# Patient Record
Sex: Male | Born: 1995 | Race: White | Hispanic: No | Marital: Single | Smoking: Former smoker
Health system: Southern US, Community
[De-identification: ages and names within clinical notes are randomized; demographics above are authoritative.]

## PROBLEM LIST (undated history)

## (undated) VITALS — BP 131/74 | HR 109 | Temp 98.1°F | Resp 16 | Ht 69.0 in | Wt 141.0 lb

## (undated) DIAGNOSIS — F329 Major depressive disorder, single episode, unspecified: Secondary | ICD-10-CM

## (undated) DIAGNOSIS — F4310 Post-traumatic stress disorder, unspecified: Secondary | ICD-10-CM

## (undated) DIAGNOSIS — F419 Anxiety disorder, unspecified: Secondary | ICD-10-CM

## (undated) DIAGNOSIS — F32A Depression, unspecified: Secondary | ICD-10-CM

## (undated) DIAGNOSIS — F603 Borderline personality disorder: Secondary | ICD-10-CM

## (undated) DIAGNOSIS — T7692XA Unspecified child maltreatment, suspected, initial encounter: Secondary | ICD-10-CM

## (undated) DIAGNOSIS — N200 Calculus of kidney: Secondary | ICD-10-CM

## (undated) HISTORY — DX: Calculus of kidney: N20.0

## (undated) HISTORY — DX: Anxiety disorder, unspecified: F41.9

## (undated) HISTORY — PX: INGUINAL HERNIA REPAIR: SUR1180

## (undated) HISTORY — DX: Unspecified child maltreatment, suspected, initial encounter: T76.92XA

## (undated) HISTORY — DX: Major depressive disorder, single episode, unspecified: F32.9

## (undated) HISTORY — DX: Depression, unspecified: F32.A

---
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
## 2014-05-28 ENCOUNTER — Encounter (HOSPITAL_COMMUNITY): Payer: Self-pay | Admitting: Emergency Medicine

## 2014-05-28 ENCOUNTER — Emergency Department (HOSPITAL_COMMUNITY)
Admission: EM | Admit: 2014-05-28 | Discharge: 2014-05-28 | Disposition: A | Discharge status: Home / Self Care | Payer: Medicaid Other | Attending: Emergency Medicine | Admitting: Emergency Medicine

## 2014-05-28 DIAGNOSIS — Z87442 Personal history of urinary calculi: Secondary | ICD-10-CM | POA: Insufficient documentation

## 2014-05-28 DIAGNOSIS — R0981 Nasal congestion: Secondary | ICD-10-CM | POA: Insufficient documentation

## 2014-05-28 DIAGNOSIS — Z8659 Personal history of other mental and behavioral disorders: Secondary | ICD-10-CM | POA: Insufficient documentation

## 2014-05-28 DIAGNOSIS — Z87891 Personal history of nicotine dependence: Secondary | ICD-10-CM | POA: Insufficient documentation

## 2014-05-28 DIAGNOSIS — J029 Acute pharyngitis, unspecified: Secondary | ICD-10-CM

## 2014-05-28 MED ORDER — AMOXICILLIN 500 MG PO CAPS: 500.0000 mg | ORAL_CAPSULE | Freq: Two times a day (BID) | ORAL | Status: DC

## 2014-05-28 NOTE — ED Notes (Signed)
Pt states he has been having sore throat, body aches, nasal congestion, and cough x 1 wk w/ fever.
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
## 2014-05-28 NOTE — ED Notes (Signed)
Patient would like to speak with someone about not being able to afford his meds, like the Child psychotherapistsocial worker or case Production designer, theatre/television/filmmanager

## 2014-05-29 NOTE — ED Provider Notes (Signed)
Medical screening examination/treatment/procedure(s) were performed by non-physician practitioner and as supervising physician I was immediately available for consultation/collaboration.   EKG Interpretation None        Purvis SheffieldForrest Sunnie Odden, MD 05/29/14 [phone]

## 2014-08-26 ENCOUNTER — Emergency Department (HOSPITAL_COMMUNITY)
Admission: EM | Admit: 2014-08-26 | Discharge: 2014-08-27 | Disposition: A | Discharge status: Home / Self Care | Payer: Medicaid Other | Attending: Emergency Medicine | Admitting: Emergency Medicine

## 2014-08-26 ENCOUNTER — Encounter (HOSPITAL_COMMUNITY): Payer: Self-pay | Admitting: Emergency Medicine

## 2014-08-26 DIAGNOSIS — R4689 Other symptoms and signs involving appearance and behavior: Secondary | ICD-10-CM

## 2014-08-26 DIAGNOSIS — T43592A Poisoning by other antipsychotics and neuroleptics, intentional self-harm, initial encounter: Secondary | ICD-10-CM | POA: Insufficient documentation

## 2014-08-26 DIAGNOSIS — Y9389 Activity, other specified: Secondary | ICD-10-CM | POA: Insufficient documentation

## 2014-08-26 DIAGNOSIS — R4589 Other symptoms and signs involving emotional state: Secondary | ICD-10-CM

## 2014-08-26 DIAGNOSIS — Y998 Other external cause status: Secondary | ICD-10-CM | POA: Insufficient documentation

## 2014-08-26 DIAGNOSIS — F329 Major depressive disorder, single episode, unspecified: Secondary | ICD-10-CM

## 2014-08-26 DIAGNOSIS — Z792 Long term (current) use of antibiotics: Secondary | ICD-10-CM | POA: Insufficient documentation

## 2014-08-26 DIAGNOSIS — X58XXXA Exposure to other specified factors, initial encounter: Secondary | ICD-10-CM | POA: Insufficient documentation

## 2014-08-26 DIAGNOSIS — F32A Depression, unspecified: Secondary | ICD-10-CM

## 2014-08-26 DIAGNOSIS — Z87442 Personal history of urinary calculi: Secondary | ICD-10-CM | POA: Insufficient documentation

## 2014-08-26 DIAGNOSIS — Z87891 Personal history of nicotine dependence: Secondary | ICD-10-CM | POA: Insufficient documentation

## 2014-08-26 DIAGNOSIS — Y9289 Other specified places as the place of occurrence of the external cause: Secondary | ICD-10-CM | POA: Insufficient documentation

## 2014-08-26 DIAGNOSIS — Z8709 Personal history of other diseases of the respiratory system: Secondary | ICD-10-CM | POA: Insufficient documentation

## 2014-08-26 DIAGNOSIS — T50902A Poisoning by unspecified drugs, medicaments and biological substances, intentional self-harm, initial encounter: Secondary | ICD-10-CM

## 2014-08-26 LAB — COMPREHENSIVE METABOLIC PANEL
ALBUMIN: 4.4 g/dL (ref 3.5–5.2)
ALT: 13 U/L (ref 0–53)
AST: 21 U/L (ref 0–37)
Alkaline Phosphatase: 66 U/L (ref 39–117)
Anion gap: 7 (ref 5–15)
BUN: 8 mg/dL (ref 6–23)
CO2: 24 mmol/L (ref 19–32)
Calcium: 8.9 mg/dL (ref 8.4–10.5)
Chloride: 106 mEq/L (ref 96–112)
Creatinine, Ser: 0.8 mg/dL (ref 0.50–1.35)
GFR calc Af Amer: >90 mL/min (ref >90)
Glucose, Bld: 121 mg/dL — ABNORMAL HIGH (ref 70–99)
POTASSIUM: 3.7 mmol/L (ref 3.5–5.1)
SODIUM: 137 mmol/L (ref 135–145)
TOTAL PROTEIN: 6.8 g/dL (ref 6.0–8.3)
Total Bilirubin: 0.9 mg/dL (ref 0.3–1.2)

## 2014-08-26 LAB — URINALYSIS, ROUTINE W REFLEX MICROSCOPIC
Bilirubin Urine: NEGATIVE
GLUCOSE, UA: NEGATIVE mg/dL
Hgb urine dipstick: NEGATIVE
Ketones, ur: NEGATIVE mg/dL
LEUKOCYTES UA: NEGATIVE
NITRITE: NEGATIVE
PH: 6 (ref 5.0–8.0)
Protein, ur: NEGATIVE mg/dL
Specific Gravity, Urine: 1.007 (ref 1.005–1.030)
Urobilinogen, UA: 1 mg/dL (ref 0.0–1.0)

## 2014-08-26 LAB — CBC
HEMATOCRIT: 44.9 % (ref 39.0–52.0)
Hemoglobin: 14.7 g/dL (ref 13.0–17.0)
MCH: 29.7 pg (ref 26.0–34.0)
MCHC: 32.7 g/dL (ref 30.0–36.0)
MCV: 90.7 fL (ref 78.0–100.0)
PLATELETS: 253 10*3/uL (ref 150–400)
RBC: 4.95 MIL/uL (ref 4.22–5.81)
RDW: 13.5 % (ref 11.5–15.5)
WBC: 6.3 10*3/uL (ref 4.0–10.5)

## 2014-08-26 LAB — SALICYLATE LEVEL: Salicylate Lvl: <4 mg/dL (ref 2.8–20.0)

## 2014-08-26 LAB — RAPID URINE DRUG SCREEN, HOSP PERFORMED
Amphetamines: NOT DETECTED
Barbiturates: NOT DETECTED
Benzodiazepines: NOT DETECTED
COCAINE: NOT DETECTED
OPIATES: NOT DETECTED
TETRAHYDROCANNABINOL: POSITIVE — AB

## 2014-08-26 LAB — ACETAMINOPHEN LEVEL: Acetaminophen (Tylenol), Serum: <10 ug/mL — ABNORMAL LOW (ref 10–30)

## 2014-08-26 LAB — ETHANOL: Alcohol, Ethyl (B): <5 mg/dL (ref 0–9)

## 2014-08-26 MED ORDER — LORAZEPAM 1 MG PO TABS
2.0000 mg | ORAL_TABLET | Freq: Once | ORAL | Status: AC
  Administered 2014-08-26: 2 mg via ORAL
  Filled 2014-08-26: qty 2
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
## 2014-08-26 NOTE — ED Notes (Signed)
Pt sent with [facility] from [facility] for med clearance. Pt has a bed there when medically cleared. Pt states that he has been depressed and suicidal and was [facility]. Pt went home and took two 150 mg Seroquel, two shots of liquor, and smoked some marijuana. Then pt [facility] and was sent here for clearance d/t his HR of 122. Pt states he is SI and denies HI.

## 2014-08-26 NOTE — ED Notes (Signed)
Spoke with lab about pending lab results. States they labs are waiting to be released.

## 2014-08-26 NOTE — Discharge Instructions (Signed)

## 2014-08-31 ENCOUNTER — Inpatient Hospital Stay (HOSPITAL_COMMUNITY)
Admission: EM | Admit: 2014-08-31 | Discharge: 2014-09-08 | DRG: 918 | Disposition: A | Discharge status: Other Acute Inpatient Hospital | Payer: Medicaid Other | Attending: Internal Medicine | Admitting: Internal Medicine

## 2014-08-31 ENCOUNTER — Encounter (HOSPITAL_COMMUNITY): Payer: Self-pay | Admitting: Emergency Medicine

## 2014-08-31 DIAGNOSIS — R45851 Suicidal ideations: Secondary | ICD-10-CM | POA: Diagnosis present

## 2014-08-31 DIAGNOSIS — F1099 Alcohol use, unspecified with unspecified alcohol-induced disorder: Secondary | ICD-10-CM | POA: Diagnosis present

## 2014-08-31 DIAGNOSIS — Z87442 Personal history of urinary calculi: Secondary | ICD-10-CM

## 2014-08-31 DIAGNOSIS — F209 Schizophrenia, unspecified: Secondary | ICD-10-CM | POA: Diagnosis present

## 2014-08-31 DIAGNOSIS — F329 Major depressive disorder, single episode, unspecified: Secondary | ICD-10-CM | POA: Diagnosis present

## 2014-08-31 DIAGNOSIS — R451 Restlessness and agitation: Secondary | ICD-10-CM | POA: Diagnosis present

## 2014-08-31 DIAGNOSIS — F419 Anxiety disorder, unspecified: Secondary | ICD-10-CM | POA: Diagnosis present

## 2014-08-31 DIAGNOSIS — R441 Visual hallucinations: Secondary | ICD-10-CM | POA: Diagnosis present

## 2014-08-31 DIAGNOSIS — T43501A Poisoning by unspecified antipsychotics and neuroleptics, accidental (unintentional), initial encounter: Secondary | ICD-10-CM | POA: Diagnosis present

## 2014-08-31 DIAGNOSIS — F1290 Cannabis use, unspecified, uncomplicated: Secondary | ICD-10-CM | POA: Diagnosis present

## 2014-08-31 DIAGNOSIS — Z62819 Personal history of unspecified abuse in childhood: Secondary | ICD-10-CM | POA: Diagnosis present

## 2014-08-31 DIAGNOSIS — T5791XA Toxic effect of unspecified inorganic substance, accidental (unintentional), initial encounter: Secondary | ICD-10-CM

## 2014-08-31 DIAGNOSIS — K5900 Constipation, unspecified: Secondary | ICD-10-CM | POA: Diagnosis not present

## 2014-08-31 DIAGNOSIS — M549 Dorsalgia, unspecified: Secondary | ICD-10-CM | POA: Diagnosis present

## 2014-08-31 DIAGNOSIS — Z79899 Other long term (current) drug therapy: Secondary | ICD-10-CM

## 2014-08-31 DIAGNOSIS — Z87891 Personal history of nicotine dependence: Secondary | ICD-10-CM

## 2014-08-31 DIAGNOSIS — R682 Dry mouth, unspecified: Secondary | ICD-10-CM | POA: Diagnosis present

## 2014-08-31 DIAGNOSIS — R569 Unspecified convulsions: Secondary | ICD-10-CM | POA: Diagnosis present

## 2014-08-31 DIAGNOSIS — R631 Polydipsia: Secondary | ICD-10-CM | POA: Diagnosis present

## 2014-08-31 DIAGNOSIS — R440 Auditory hallucinations: Secondary | ICD-10-CM | POA: Diagnosis present

## 2014-08-31 DIAGNOSIS — T43502A Poisoning by unspecified antipsychotics and neuroleptics, intentional self-harm, initial encounter: Secondary | ICD-10-CM

## 2014-08-31 DIAGNOSIS — E876 Hypokalemia: Secondary | ICD-10-CM | POA: Diagnosis present

## 2014-08-31 DIAGNOSIS — J309 Allergic rhinitis, unspecified: Secondary | ICD-10-CM | POA: Diagnosis present

## 2014-08-31 DIAGNOSIS — T43592A Poisoning by other antipsychotics and neuroleptics, intentional self-harm, initial encounter: Principal | ICD-10-CM | POA: Diagnosis present

## 2014-08-31 DIAGNOSIS — Y900 Blood alcohol level of less than 20 mg/100 ml: Secondary | ICD-10-CM | POA: Diagnosis present

## 2014-08-31 DIAGNOSIS — F32A Depression, unspecified: Secondary | ICD-10-CM | POA: Diagnosis present

## 2014-08-31 DIAGNOSIS — R0789 Other chest pain: Secondary | ICD-10-CM | POA: Diagnosis present

## 2014-08-31 DIAGNOSIS — Z91018 Allergy to other foods: Secondary | ICD-10-CM | POA: Diagnosis not present

## 2014-08-31 DIAGNOSIS — N179 Acute kidney failure, unspecified: Secondary | ICD-10-CM | POA: Diagnosis present

## 2014-08-31 DIAGNOSIS — T6591XA Toxic effect of unspecified substance, accidental (unintentional), initial encounter: Secondary | ICD-10-CM | POA: Insufficient documentation

## 2014-08-31 DIAGNOSIS — T1491XA Suicide attempt, initial encounter: Secondary | ICD-10-CM

## 2014-08-31 DIAGNOSIS — R000 Tachycardia, unspecified: Secondary | ICD-10-CM | POA: Diagnosis present

## 2014-08-31 LAB — RAPID URINE DRUG SCREEN, HOSP PERFORMED
Amphetamines: NOT DETECTED
BENZODIAZEPINES: NOT DETECTED
Barbiturates: NOT DETECTED
Cocaine: NOT DETECTED
OPIATES: NOT DETECTED
TETRAHYDROCANNABINOL: POSITIVE — AB

## 2014-08-31 LAB — CBC WITH DIFFERENTIAL/PLATELET
BASOS ABS: 0 10*3/uL (ref 0.0–0.1)
BASOS PCT: 0 % (ref 0–1)
EOS ABS: 0 10*3/uL (ref 0.0–0.7)
Eosinophils Relative: 0 % (ref 0–5)
HCT: 43.9 % (ref 39.0–52.0)
Hemoglobin: 14.8 g/dL (ref 13.0–17.0)
LYMPHS ABS: 2.3 10*3/uL (ref 0.7–4.0)
Lymphocytes Relative: 21 % (ref 12–46)
MCH: 30.5 pg (ref 26.0–34.0)
MCHC: 33.7 g/dL (ref 30.0–36.0)
MCV: 90.3 fL (ref 78.0–100.0)
MONOS PCT: 13 % — AB (ref 3–12)
Monocytes Absolute: 1.4 10*3/uL — ABNORMAL HIGH (ref 0.1–1.0)
Neutro Abs: 7.4 10*3/uL (ref 1.7–7.7)
Neutrophils Relative %: 66 % (ref 43–77)
Platelets: 215 10*3/uL (ref 150–400)
RBC: 4.86 MIL/uL (ref 4.22–5.81)
RDW: 14.3 % (ref 11.5–15.5)
WBC: 11.1 10*3/uL — AB (ref 4.0–10.5)

## 2014-08-31 LAB — BASIC METABOLIC PANEL
Anion gap: 10 (ref 5–15)
BUN: 12 mg/dL (ref 6–23)
CHLORIDE: 115 meq/L — AB (ref 96–112)
CO2: 21 mmol/L (ref 19–32)
CREATININE: 1.94 mg/dL — AB (ref 0.50–1.35)
Calcium: 8.8 mg/dL (ref 8.4–10.5)
GFR, EST AFRICAN AMERICAN: 56 mL/min — AB (ref >90)
GFR, EST NON AFRICAN AMERICAN: 48 mL/min — AB (ref >90)
Glucose, Bld: 97 mg/dL (ref 70–99)
Potassium: 3.3 mmol/L — ABNORMAL LOW (ref 3.5–5.1)
Sodium: 146 mmol/L — ABNORMAL HIGH (ref 135–145)

## 2014-08-31 LAB — ETHANOL

## 2014-08-31 LAB — COMPREHENSIVE METABOLIC PANEL
ANION GAP: 22 — AB (ref 5–15)
AST: 55 U/L — ABNORMAL HIGH (ref 0–37)
Albumin: 3.8 g/dL (ref 3.5–5.2)
Alkaline Phosphatase: 70 U/L (ref 39–117)
BILIRUBIN TOTAL: 0.8 mg/dL (ref 0.3–1.2)
BUN: 14 mg/dL (ref 6–23)
CALCIUM: 9.4 mg/dL (ref 8.4–10.5)
CO2: 14 mmol/L — AB (ref 19–32)
Chloride: 109 mEq/L (ref 96–112)
Creatinine, Ser: 2.72 mg/dL — ABNORMAL HIGH (ref 0.50–1.35)
GFR, EST AFRICAN AMERICAN: 37 mL/min — AB (ref >90)
GFR, EST NON AFRICAN AMERICAN: 32 mL/min — AB (ref >90)
Glucose, Bld: 190 mg/dL — ABNORMAL HIGH (ref 70–99)
POTASSIUM: 3.6 mmol/L (ref 3.5–5.1)
Sodium: 145 mmol/L (ref 135–145)
Total Protein: 6.7 g/dL (ref 6.0–8.3)

## 2014-08-31 LAB — SALICYLATE LEVEL: Salicylate Lvl: <4 mg/dL (ref 2.8–20.0)

## 2014-08-31 LAB — MAGNESIUM
MAGNESIUM: 2.2 mg/dL (ref 1.5–2.5)
Magnesium: 2.5 mg/dL (ref 1.5–2.5)

## 2014-08-31 LAB — ACETAMINOPHEN LEVEL: Acetaminophen (Tylenol), Serum: <10 ug/mL — ABNORMAL LOW (ref 10–30)

## 2014-08-31 LAB — MRSA PCR SCREENING: MRSA BY PCR: NEGATIVE

## 2014-08-31 MED ORDER — HALOPERIDOL LACTATE 5 MG/ML IJ SOLN
5.0000 mg | Freq: Once | INTRAMUSCULAR | Status: DC
  Filled 2014-08-31: qty 1

## 2014-08-31 MED ORDER — POTASSIUM CHLORIDE CRYS ER 20 MEQ PO TBCR
40.0000 meq | EXTENDED_RELEASE_TABLET | Freq: Once | ORAL | Status: AC
  Administered 2014-08-31: 40 meq via ORAL
  Filled 2014-08-31: qty 2

## 2014-08-31 MED ORDER — LORAZEPAM 2 MG/ML IJ SOLN
2.0000 mg | Freq: Once | INTRAMUSCULAR | Status: AC
  Administered 2014-08-31: 2 mg via INTRAVENOUS
  Filled 2014-08-31: qty 1

## 2014-08-31 MED ORDER — SODIUM CHLORIDE 0.9 % IV SOLN
INTRAVENOUS | Status: DC
  Administered 2014-09-01: 100 mL/h via INTRAVENOUS

## 2014-08-31 MED ORDER — SODIUM CHLORIDE 0.9 % IV BOLUS (SEPSIS)
1000.0000 mL | Freq: Once | INTRAVENOUS | Status: AC
  Administered 2014-08-31: 1000 mL via INTRAVENOUS

## 2014-08-31 MED ORDER — HALOPERIDOL LACTATE 5 MG/ML IJ SOLN
5.0000 mg | Freq: Once | INTRAMUSCULAR | Status: AC
  Administered 2014-08-31: 5 mg via INTRAVENOUS
  Filled 2014-08-31: qty 1

## 2014-08-31 MED ORDER — HALOPERIDOL LACTATE 5 MG/ML IJ SOLN
INTRAMUSCULAR | Status: AC
  Filled 2014-08-31: qty 1

## 2014-08-31 MED ORDER — NALOXONE HCL 1 MG/ML IJ SOLN
1.0000 mg | Freq: Once | INTRAMUSCULAR | Status: AC
  Administered 2014-08-31: 1 mg via INTRAVENOUS
  Filled 2014-08-31: qty 2

## 2014-08-31 MED ORDER — ENOXAPARIN SODIUM 40 MG/0.4ML ~~LOC~~ SOLN
40.0000 mg | SUBCUTANEOUS | Status: DC
  Administered 2014-08-31 – 2014-09-07 (×7): 40 mg via SUBCUTANEOUS
  Filled 2014-08-31 (×9): qty 0.4

## 2014-08-31 MED ORDER — SODIUM CHLORIDE 0.9 % IJ SOLN
3.0000 mL | Freq: Two times a day (BID) | INTRAMUSCULAR | Status: DC
  Administered 2014-08-31 – 2014-09-03 (×2): 3 mL via INTRAVENOUS

## 2014-08-31 MED ORDER — HALOPERIDOL LACTATE 5 MG/ML IJ SOLN
5.0000 mg | Freq: Four times a day (QID) | INTRAMUSCULAR | Status: DC | PRN
  Administered 2014-08-31: 5 mg via INTRAVENOUS

## 2014-08-31 MED ORDER — LORAZEPAM 2 MG/ML IJ SOLN: 1.0000 mg | Freq: Once | INTRAMUSCULAR | Status: DC

## 2014-08-31 MED ORDER — LORAZEPAM 2 MG/ML IJ SOLN
1.0000 mg | Freq: Once | INTRAMUSCULAR | Status: AC
  Administered 2014-08-31: 1 mg via INTRAVENOUS
  Filled 2014-08-31: qty 1

## 2014-08-31 MED ORDER — WHITE PETROLATUM GEL
Status: AC
  Administered 2014-08-31: 0.2
  Filled 2014-08-31: qty 1

## 2014-08-31 MED ORDER — LORAZEPAM 2 MG/ML IJ SOLN
1.0000 mg | INTRAMUSCULAR | Status: DC | PRN
  Administered 2014-08-31 – 2014-09-01 (×2): 1 mg via INTRAVENOUS
  Filled 2014-08-31 (×2): qty 1

## 2014-08-31 NOTE — ED Notes (Signed)
Per poison control pt needs EKG prior to discharge

## 2014-08-31 NOTE — ED Notes (Signed)
Attempted report 

## 2014-08-31 NOTE — ED Notes (Signed)
Sitter at bedside.

## 2014-08-31 NOTE — ED Notes (Signed)
Admitting MD at bedside.

## 2014-08-31 NOTE — ED Notes (Signed)
Pt was standing with assistance of James, EMT to attempt a urine sample. Pt then dropped urinal on the floor. Unable to obtain sample.

## 2014-08-31 NOTE — ED Notes (Addendum)
Pt via [facility].  Roommate reported pt stumbled out of his room this am and fell having seizure like activity.  Went in room and saw two empty 300mg  seroquel bottles filled on Jan 6th and 7th for a total 37 pills.  EMS reported agonal breathing. GCS 10 on arrival. EKG showed questionable SVT or ST.  NAD at this time.

## 2014-08-31 NOTE — ED Notes (Signed)
Attempted orthostatics. Completed orthostatics laying down and sitting up. Patient is unable to stand up safely at this time.

## 2014-08-31 NOTE — ED Notes (Signed)
Bladder scan showed max volume of after 4 readings

## 2014-08-31 NOTE — ED Notes (Signed)
Pt became awake and alert.  Refused to tell this RN what happened or what he remembered. Notified Dr Littie DeedsGentry.
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
## 2014-08-31 NOTE — ED Notes (Signed)
t-shirt was cut off by EMS, boxers removed, samsung cell phone given to security.
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
## 2014-08-31 NOTE — ED Notes (Addendum)
Per poison control, no charcoal due to unknown time of ingestion.  Possible side effects of OD of Seroquel tachycardia, seizures, QT prolongation, and urinary retention.  Peak time for this medication is approx 6 hours.

## 2014-09-01 DIAGNOSIS — N179 Acute kidney failure, unspecified: Secondary | ICD-10-CM

## 2014-09-01 DIAGNOSIS — F19921 Other psychoactive substance use, unspecified with intoxication with delirium: Secondary | ICD-10-CM

## 2014-09-01 LAB — CBC
HCT: 38.1 % — ABNORMAL LOW (ref 39.0–52.0)
HEMOGLOBIN: 12.6 g/dL — AB (ref 13.0–17.0)
MCH: 30.1 pg (ref 26.0–34.0)
MCHC: 33.1 g/dL (ref 30.0–36.0)
MCV: 90.9 fL (ref 78.0–100.0)
Platelets: 185 10*3/uL (ref 150–400)
RBC: 4.19 MIL/uL — AB (ref 4.22–5.81)
RDW: 14.6 % (ref 11.5–15.5)
WBC: 6.9 10*3/uL (ref 4.0–10.5)

## 2014-09-01 LAB — COMPREHENSIVE METABOLIC PANEL
ALT: 20 U/L (ref 0–53)
AST: 28 U/L (ref 0–37)
Albumin: 3.2 g/dL — ABNORMAL LOW (ref 3.5–5.2)
Alkaline Phosphatase: 57 U/L (ref 39–117)
Anion gap: 9 (ref 5–15)
BUN: 12 mg/dL (ref 6–23)
CALCIUM: 8.7 mg/dL (ref 8.4–10.5)
CHLORIDE: 118 meq/L — AB (ref 96–112)
CO2: 20 mmol/L (ref 19–32)
Creatinine, Ser: 1.64 mg/dL — ABNORMAL HIGH (ref 0.50–1.35)
GFR calc non Af Amer: 59 mL/min — ABNORMAL LOW (ref >90)
GFR, EST AFRICAN AMERICAN: 68 mL/min — AB (ref >90)
Glucose, Bld: 86 mg/dL (ref 70–99)
POTASSIUM: 4 mmol/L (ref 3.5–5.1)
SODIUM: 147 mmol/L — AB (ref 135–145)
TOTAL PROTEIN: 5.5 g/dL — AB (ref 6.0–8.3)
Total Bilirubin: 1 mg/dL (ref 0.3–1.2)

## 2014-09-01 MED ORDER — HALOPERIDOL LACTATE 5 MG/ML IJ SOLN
5.0000 mg | Freq: Two times a day (BID) | INTRAMUSCULAR | Status: DC | PRN
  Administered 2014-09-01 (×2): 5 mg via INTRAVENOUS
  Filled 2014-09-01: qty 1

## 2014-09-01 MED ORDER — HALOPERIDOL LACTATE 5 MG/ML IJ SOLN
INTRAMUSCULAR | Status: AC
  Filled 2014-09-01: qty 1

## 2014-09-01 MED ORDER — SODIUM CHLORIDE 0.9 % IV SOLN: INTRAVENOUS | Status: AC

## 2014-09-01 NOTE — Progress Notes (Signed)
Subjective:  Patient was seen and examined this morning. Patient complains of dry mouth and thirst. He is awake, alert, oriented x 2, believes it is 1995. His thoughts are coherent, but he does admit to remembering his overdose and does not comment on how many pills he took. He denies any chest pain, palpitations, shortness of breath, abdominal pain. He has been urinating without difficulty. Patient is hungry and would like to eat.   Objective: Vital signs in last 24 hours: Filed Vitals:   09/01/14 0055 09/01/14 0200 09/01/14 0801 09/01/14 1209  BP: 118/77 127/82 147/80 146/86  Pulse: 78 86 98 100  Temp:  98.3 F (36.8 C) 98.8 F (37.1 C) 98.3 F (36.8 C)  TempSrc:  Axillary Oral Oral  Resp: Weight:      SpO2: 98% 99% 99% 99%   Weight change:   Intake/Output Summary (Last 24 hours) at 09/01/14 1328 Last data filed at 09/01/14 1126  Gross per 24 hour  Intake 2219.67 ml  Output   2050 ml  Net 169.67 ml   General: Vital signs reviewed. Patient is well-developed and well-nourished, in no acute distress. HEENT: PERRLA. Normocephalic and atraumatic. EOMI, conjunctivae normal, no scleral icterus.  Cardiovascular: Tachycardic, regular rhythm, S1 normal, S2 normal, no murmurs, gallops, or rubs. Pulmonary/Chest: Clear to auscultation bilaterally, no wheezes, rales, or rhonchi. Abdominal: Soft, non-tender, non-distended, BS + Extremities: No lower extremity edema bilaterally Neurological: A&Ox2- not oriented to year, Strength is normal and symmetric bilaterally, cranial nerve II-XII are grossly intact, no focal motor deficit, sensory intact to light touch bilaterally.  Skin: Warm, dry and intact. No rashes or erythema. Psychiatric: Agitated. Nervous. Skeptical of people in the room. Acts younger than actual age.   Lab Results: Basic Metabolic Panel:  Recent Labs Lab 08/31/14 0814 08/31/14 1823 09/01/14 0342  NA 145 146* 147*  K 3.6 3.3* 4.0  CL 109 115* 118*    CO2 14* 21 20  GLUCOSE 190* 97 86  BUN CREATININE 2.72* 1.94* 1.64*  CALCIUM 9.4 8.8 8.7  MG 2.5 2.2  --    Liver Function Tests:  Recent Labs Lab 08/31/14 0814 09/01/14 0342  AST 55* 28  ALT <5 20  ALKPHOS 70 57  BILITOT 0.8 1.0  PROT 6.7 5.5*  ALBUMIN 3.8 3.2*   CBC:  Recent Labs Lab 08/31/14 0814 09/01/14 0342  WBC 11.1* 6.9  NEUTROABS 7.4  --   HGB 14.8 12.6*  HCT 43.9 38.1*  MCV 90.3 90.9  PLT 215 185   Urine Drug Screen: Drugs of Abuse     Component Value Date/Time   LABOPIA NONE DETECTED 08/31/2014 1327   LABOPIA NEGATIVE 07/09/2007 2142   COCAINSCRNUR NONE DETECTED 08/31/2014 1327   COCAINSCRNUR NEGATIVE 07/09/2007 2142   LABBENZ NONE DETECTED 08/31/2014 1327   LABBENZ NEGATIVE 07/09/2007 2142   AMPHETMU NONE DETECTED 08/31/2014 1327   AMPHETMU NEGATIVE 07/09/2007 2142   THCU POSITIVE* 08/31/2014 1327   LABBARB NONE DETECTED 08/31/2014 1327    Alcohol Level:  Recent Labs Lab 08/26/14 2112 08/31/14 0814  ETH <5 <5   Urinalysis:  Recent Labs Lab 08/26/14 2110  COLORURINE YELLOW  LABSPEC 1.007  PHURINE 6.0  GLUCOSEU NEGATIVE  HGBUR NEGATIVE  BILIRUBINUR NEGATIVE  KETONESUR NEGATIVE  PROTEINUR NEGATIVE  UROBILINOGEN 1.0  NITRITE NEGATIVE  LEUKOCYTESUR NEGATIVE   Micro Results: Recent Results (from the past 240 hour(s))  MRSA PCR Screening     Status: None  Collection Time: 08/31/14  5:29 PM  Result Value Ref Range Status   MRSA by PCR NEGATIVE NEGATIVE Final    Comment:        The GeneXpert MRSA Assay (FDA approved for NASAL specimens only), is one component of a comprehensive MRSA colonization surveillance program. It is not intended to diagnose MRSA infection nor to guide or monitor treatment for MRSA infections.    Medications:  I have reviewed the patient's current medications. Prior to Admission:  Prescriptions prior to admission  Medication Sig Dispense Refill Last Dose  . PARoxetine (PAXIL) 10 MG  tablet Take 10 mg by mouth daily.   unknown  . QUEtiapine (SEROQUEL XR) 300 MG 24 hr tablet Take 300 mg by mouth at bedtime.   1/9 or 1/10   Scheduled Meds: . enoxaparin (LOVENOX) injection  40 mg Subcutaneous Q24H  . sodium chloride  3 mL Intravenous Q12H   Continuous Infusions: . [START ON 09/02/2014] sodium chloride     PRN Meds:.haloperidol lactate, LORazepam Assessment/Plan: Active Problems:   Ingestion of substance   Overdose of antipsychotic  Quetiapine Overdose: Overnight, patient was combative, swinging at nurses and trying to leave. Ativan 1 mg was given x 2. EKG was check and QTc was 471. Patient did not calm down after 2 ativan, so patient was given Haldol 5 mg once. EKG recheck and QTc remained in the 470s. Patient received another haldol 5 mg. Patient required wrist and ankle restraints. UDS positive only for THC. Blood pressure has been stable 120-140s/80s. This morning, patient was alert and awake acting more at his baseline and calm. Patient's only complaint was dry mouth and hunger. QTc this am 468 with sinus tachycardia. Patient is out of peak time of toxicity, but can last 12-48 hours. Psychology will see the patient. Patient will likely need inpatient psych admission.  -NS 100 cc/hr  -CBC/BMET tomorrow am -Regular diet -EKG tomorrow am -Seizure precautions -Suicide precautions -Bed rest -Drug Screen Panel -Sitter -Cardiac Monitor -Psychiatry following, appreciate recommendations  AKI: Creatinine 1.94>1.64, improving. Baseline 0.8.  -Continue NS 100 cc/hr  -Repeat BMET tomorrow am  Schizophrenia, Depression, H/o Abuse: Patient is on Seroquel XR 300 mg QHS and Paroxetine 20 mg daily at home. Patient follows with [facility]. -Hold psychiatric medications in setting of overdose -Psychiatry following, appreciate recommendations  DVT/PE ppx: Lovenox 40 mg SQ  Dispo: Disposition is deferred at this time, awaiting improvement of current medical problems.  Anticipated  discharge in approximately 1-2 day(s).   The patient does have a current PCP (Rondall Leandra KernA Young, MD) and does not need [facility] after discharge.  The patient does have transportation limitations that hinder transportation to clinic appointments.  .Services Needed at time of discharge: Y = Yes, Blank = No PT:   OT:   RN:   Equipment:   Other:     LOS: 1 day   Jill AlexandersAlexa Richardson, DO PGY-1 Internal Medicine Resident Pager # [phone] 09/01/2014 1:28 PM
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
## 2014-09-01 NOTE — Progress Notes (Signed)
Poison control called for update on pt. Given latest vital signs and labs. Read EKG QTC of 0.47  to them. No further suggestions for care given
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
## 2014-09-01 NOTE — Progress Notes (Signed)
Poor response to first dose of Haldol. Pt continues to be very aggressive . He is confused and hallucinates. Second dose of Haldol 5mg  administered slowly IV.

## 2014-09-01 NOTE — Progress Notes (Signed)
Chaplain responded to consult placed. Pt sleeping.  Gala RomneyBrown, Richel Millspaugh J, Chaplain 09/01/2014
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
## 2014-09-01 NOTE — Progress Notes (Signed)
Pt slept comfortably . Awaked aggitated and confused. Dr. Valentino Saxonothman notified. EKG obtained as requested. Informed of QTC interval results. Haldol 5mg  administered slowly IV.  Pt less combative this morning. Cooperative when given mouth care. VS obtained and remain stable. Will continue to monitor closely.

## 2014-09-01 NOTE — Progress Notes (Signed)
Little response to Ativan. Pt still aggitated. Constantly climbing OOB with sitter at his side. It has been difficult keeping him in the bed. Pt continues to strike out at nursing staff. Dr Valentino Saxonothman paged again for additional orders. Back in to see pt once more. EKG obtained and Dr. Valentino Saxonothman made aware of the QTC. Little improvement. New orders received. Haldol 5mg  administered slowly IV.

## 2014-09-01 NOTE — Progress Notes (Signed)
12 Lead EKG done QTC 0.45

## 2014-09-02 DIAGNOSIS — Y929 Unspecified place or not applicable: Secondary | ICD-10-CM

## 2014-09-02 DIAGNOSIS — F209 Schizophrenia, unspecified: Secondary | ICD-10-CM

## 2014-09-02 DIAGNOSIS — Z62819 Personal history of unspecified abuse in childhood: Secondary | ICD-10-CM

## 2014-09-02 DIAGNOSIS — E876 Hypokalemia: Secondary | ICD-10-CM

## 2014-09-02 LAB — BASIC METABOLIC PANEL
Anion gap: 14 (ref 5–15)
BUN: 8 mg/dL (ref 6–23)
CO2: 19 mmol/L (ref 19–32)
Calcium: 9 mg/dL (ref 8.4–10.5)
Chloride: 109 mEq/L (ref 96–112)
Creatinine, Ser: 1.08 mg/dL (ref 0.50–1.35)
GFR calc Af Amer: >90 mL/min (ref >90)
GFR calc non Af Amer: >90 mL/min (ref >90)
GLUCOSE: 95 mg/dL (ref 70–99)
Potassium: 3.4 mmol/L — ABNORMAL LOW (ref 3.5–5.1)
SODIUM: 142 mmol/L (ref 135–145)

## 2014-09-02 LAB — CBC
HCT: 39.5 % (ref 39.0–52.0)
Hemoglobin: 13.2 g/dL (ref 13.0–17.0)
MCH: 30.8 pg (ref 26.0–34.0)
MCHC: 33.4 g/dL (ref 30.0–36.0)
MCV: 92.1 fL (ref 78.0–100.0)
Platelets: 180 10*3/uL (ref 150–400)
RBC: 4.29 MIL/uL (ref 4.22–5.81)
RDW: 14.2 % (ref 11.5–15.5)
WBC: 4.9 10*3/uL (ref 4.0–10.5)

## 2014-09-02 MED ORDER — ACETAMINOPHEN 325 MG PO TABS
650.0000 mg | ORAL_TABLET | Freq: Four times a day (QID) | ORAL | Status: DC | PRN
  Administered 2014-09-03 – 2014-09-07 (×3): 650 mg via ORAL
  Filled 2014-09-02 (×3): qty 2

## 2014-09-02 MED ORDER — POTASSIUM CHLORIDE CRYS ER 20 MEQ PO TBCR
40.0000 meq | EXTENDED_RELEASE_TABLET | Freq: Once | ORAL | Status: AC
  Administered 2014-09-02: 40 meq via ORAL
  Filled 2014-09-02: qty 2
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
## 2014-09-02 NOTE — Clinical Social Work Psych Note (Signed)
Psych CSW received consult for patient regarding possible placement, possibly long-term [facility]  Patient has extensive hx of inpatient psychiatric admission to local facilities as well as [facility]  Once medically stable, placement will be sought.    Vickii PennaGina Courteney Alderete, LCSWA [phone]  Psychiatric & Orthopedics (5N 1-16) Clinical Social Worker

## 2014-09-02 NOTE — Progress Notes (Signed)
Report given to Callenderish, Charity fundraiserN. Pt safely transported to [facility], [employer] [facility] notified of arrival. Called and updated pt's foster stepmom and his significant other.

## 2014-09-03 DIAGNOSIS — R45851 Suicidal ideations: Secondary | ICD-10-CM

## 2014-09-03 DIAGNOSIS — T43592A Poisoning by other antipsychotics and neuroleptics, intentional self-harm, initial encounter: Principal | ICD-10-CM

## 2014-09-03 DIAGNOSIS — F3160 Bipolar disorder, current episode mixed, unspecified: Secondary | ICD-10-CM

## 2014-09-03 DIAGNOSIS — R000 Tachycardia, unspecified: Secondary | ICD-10-CM | POA: Insufficient documentation

## 2014-09-03 DIAGNOSIS — F1910 Other psychoactive substance abuse, uncomplicated: Secondary | ICD-10-CM

## 2014-09-03 LAB — BASIC METABOLIC PANEL
Anion gap: 5 (ref 5–15)
BUN: 10 mg/dL (ref 6–23)
CALCIUM: 8.9 mg/dL (ref 8.4–10.5)
CO2: 27 mmol/L (ref 19–32)
Chloride: 104 mEq/L (ref 96–112)
Creatinine, Ser: 0.99 mg/dL (ref 0.50–1.35)
GFR calc Af Amer: >90 mL/min (ref >90)
Glucose, Bld: 95 mg/dL (ref 70–99)
POTASSIUM: 3.6 mmol/L (ref 3.5–5.1)
SODIUM: 136 mmol/L (ref 135–145)
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
## 2014-09-04 DIAGNOSIS — T438X2A Poisoning by other psychotropic drugs, intentional self-harm, initial encounter: Secondary | ICD-10-CM

## 2014-09-04 LAB — BASIC METABOLIC PANEL
ANION GAP: 16 — AB (ref 5–15)
BUN: 11 mg/dL (ref 6–23)
CALCIUM: 9.7 mg/dL (ref 8.4–10.5)
CHLORIDE: 102 meq/L (ref 96–112)
CO2: 23 mmol/L (ref 19–32)
Creatinine, Ser: 0.92 mg/dL (ref 0.50–1.35)
GFR calc Af Amer: >90 mL/min (ref >90)
GFR calc non Af Amer: >90 mL/min (ref >90)
GLUCOSE: 87 mg/dL (ref 70–99)
Potassium: 3.9 mmol/L (ref 3.5–5.1)
Sodium: 141 mmol/L (ref 135–145)

## 2014-09-04 MED ORDER — CLONAZEPAM 0.5 MG PO TABS
0.5000 mg | ORAL_TABLET | Freq: Every day | ORAL | Status: DC
  Administered 2014-09-04 – 2014-09-07 (×4): 0.5 mg via ORAL
  Filled 2014-09-04 (×4): qty 1

## 2014-09-04 MED ORDER — LORAZEPAM 1 MG PO TABS
1.0000 mg | ORAL_TABLET | Freq: Four times a day (QID) | ORAL | Status: DC | PRN
  Administered 2014-09-04 – 2014-09-07 (×4): 1 mg via ORAL
  Filled 2014-09-04 (×4): qty 1

## 2014-09-04 MED ORDER — PAROXETINE HCL 10 MG PO TABS
10.0000 mg | ORAL_TABLET | Freq: Every day | ORAL | Status: DC
  Administered 2014-09-04 – 2014-09-08 (×5): 10 mg via ORAL
  Filled 2014-09-04 (×6): qty 1

## 2014-09-04 NOTE — Progress Notes (Signed)
Patient would like to speak to MD regarding discharge. Attending physician called. Awaiting for call back. Patient aware.   Sim BoastHavy, RN
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
## 2014-09-04 NOTE — Clinical Social Work Psych Note (Addendum)
Psych CSW contacted the following facilities regarding psychiatric hospitalization: [facility]- at capacity [facility]- denied aggressive behaviors [facility]- denied "needing high level of care"  Psych CSW [facility]- [facility]  [facility] received expiring on 09/10/2014-  [id number]  Vickii PennaGina Keelon Zurn, LCSWA [phone]  Psychiatric & Orthopedics (5N 1-16) Clinical Social Worker

## 2014-09-05 NOTE — Clinical Social Work Psych Note (Signed)
[facility] per [place].  Vickii PennaGina Nadiyah Zeis, LCSWA [phone]  Psychiatric & Orthopedics (5N 1-16) Clinical Social Worker
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
## 2014-09-07 NOTE — BH Assessment (Signed)
[facility]    [facility] and he is per [place] at 1218.  Updated TTS staff.  Casimer LaniusKristen Aleda Madl, MS, [facility] Licensed Professional Counselor Therapeutic Triage Specialist [facility] Phone: [phone] Fax: [phone]

## 2014-09-07 NOTE — Clinical Social Work Note (Signed)
[facility] and spoke with [place]. Patient remains on the waitlist and facility at capacity. CSW to follow tomorrow.  Henri Guedes Patrick-Jefferson, LCSWA Weekend Clinical Social Worker [phone]
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
## 2014-09-08 ENCOUNTER — Inpatient Hospital Stay (HOSPITAL_COMMUNITY)
Admission: AD | Admit: 2014-09-08 | Discharge: 2014-09-14 | DRG: 885 | Disposition: A | Discharge status: Home / Self Care | Payer: Federal, State, Local not specified - Other | Source: Intra-hospital | Attending: Psychiatry | Admitting: Psychiatry

## 2014-09-08 ENCOUNTER — Encounter (HOSPITAL_COMMUNITY): Payer: Self-pay | Admitting: *Deleted

## 2014-09-08 DIAGNOSIS — F3130 Bipolar disorder, current episode depressed, mild or moderate severity, unspecified: Secondary | ICD-10-CM | POA: Diagnosis present

## 2014-09-08 DIAGNOSIS — F4310 Post-traumatic stress disorder, unspecified: Secondary | ICD-10-CM | POA: Diagnosis present

## 2014-09-08 DIAGNOSIS — F411 Generalized anxiety disorder: Secondary | ICD-10-CM | POA: Diagnosis present

## 2014-09-08 DIAGNOSIS — R45851 Suicidal ideations: Secondary | ICD-10-CM | POA: Diagnosis present

## 2014-09-08 DIAGNOSIS — G4700 Insomnia, unspecified: Secondary | ICD-10-CM | POA: Diagnosis present

## 2014-09-08 DIAGNOSIS — F17210 Nicotine dependence, cigarettes, uncomplicated: Secondary | ICD-10-CM | POA: Diagnosis present

## 2014-09-08 DIAGNOSIS — K5900 Constipation, unspecified: Secondary | ICD-10-CM

## 2014-09-08 DIAGNOSIS — Z9114 Patient's other noncompliance with medication regimen: Secondary | ICD-10-CM | POA: Diagnosis present

## 2014-09-08 DIAGNOSIS — F4010 Social phobia, unspecified: Secondary | ICD-10-CM | POA: Diagnosis present

## 2014-09-08 DIAGNOSIS — F410 Panic disorder [episodic paroxysmal anxiety] without agoraphobia: Secondary | ICD-10-CM | POA: Diagnosis present

## 2014-09-08 DIAGNOSIS — R51 Headache: Secondary | ICD-10-CM | POA: Diagnosis not present

## 2014-09-08 DIAGNOSIS — Z62810 Personal history of physical and sexual abuse in childhood: Secondary | ICD-10-CM | POA: Diagnosis present

## 2014-09-08 DIAGNOSIS — Z23 Encounter for immunization: Secondary | ICD-10-CM

## 2014-09-08 DIAGNOSIS — F319 Bipolar disorder, unspecified: Secondary | ICD-10-CM | POA: Diagnosis present

## 2014-09-08 DIAGNOSIS — F3160 Bipolar disorder, current episode mixed, unspecified: Secondary | ICD-10-CM | POA: Diagnosis present

## 2014-09-08 MED ORDER — PAROXETINE HCL 10 MG PO TABS
10.0000 mg | ORAL_TABLET | Freq: Every day | ORAL | Status: DC
  Administered 2014-09-09: 10 mg via ORAL
  Filled 2014-09-08 (×3): qty 1

## 2014-09-08 MED ORDER — CLONAZEPAM 0.5 MG PO TABS
0.5000 mg | ORAL_TABLET | Freq: Every day | ORAL | Status: DC
  Administered 2014-09-08: 0.5 mg via ORAL
  Filled 2014-09-08: qty 1

## 2014-09-08 MED ORDER — HYDROXYZINE HCL 25 MG PO TABS
ORAL_TABLET | ORAL | Status: AC
  Filled 2014-09-08: qty 1

## 2014-09-08 MED ORDER — MAGNESIUM HYDROXIDE 400 MG/5ML PO SUSP: 30.0000 mL | Freq: Every day | ORAL | Status: DC | PRN

## 2014-09-08 MED ORDER — OLANZAPINE 5 MG PO TBDP
5.0000 mg | ORAL_TABLET | Freq: Three times a day (TID) | ORAL | Status: DC | PRN
  Administered 2014-09-08: 5 mg via ORAL
  Filled 2014-09-08: qty 1

## 2014-09-08 MED ORDER — SENNOSIDES-DOCUSATE SODIUM 8.6-50 MG PO TABS
1.0000 | ORAL_TABLET | Freq: Once | ORAL | Status: AC
  Administered 2014-09-08: 1 via ORAL
  Filled 2014-09-08: qty 1

## 2014-09-08 MED ORDER — ACETAMINOPHEN 325 MG PO TABS
650.0000 mg | ORAL_TABLET | Freq: Four times a day (QID) | ORAL | Status: DC | PRN
  Administered 2014-09-09 – 2014-09-13 (×3): 650 mg via ORAL
  Filled 2014-09-08 (×3): qty 2

## 2014-09-08 MED ORDER — CLONAZEPAM 0.5 MG PO TABS: 0.5000 mg | ORAL_TABLET | Freq: Every day | ORAL | Status: DC

## 2014-09-08 MED ORDER — ALUM & MAG HYDROXIDE-SIMETH 200-200-20 MG/5ML PO SUSP: 30.0000 mL | ORAL | Status: DC | PRN

## 2014-09-08 MED ORDER — CLONAZEPAM 0.5 MG PO TBDP: 0.5000 mg | ORAL_TABLET | Freq: Every day | ORAL | Status: DC

## 2014-09-08 MED ORDER — INFLUENZA VAC SPLIT QUAD 0.5 ML IM SUSY
0.5000 mL | PREFILLED_SYRINGE | INTRAMUSCULAR | Status: AC
  Administered 2014-09-10: 0.5 mL via INTRAMUSCULAR
  Filled 2014-09-08: qty 0.5

## 2014-09-08 MED ORDER — POLYETHYLENE GLYCOL 3350 17 G PO PACK
17.0000 g | PACK | Freq: Every day | ORAL | Status: DC
  Administered 2014-09-08: 17 g via ORAL
  Filled 2014-09-08: qty 1

## 2014-09-08 MED ORDER — HYDROXYZINE HCL 25 MG PO TABS
25.0000 mg | ORAL_TABLET | Freq: Four times a day (QID) | ORAL | Status: DC | PRN
  Administered 2014-09-08 – 2014-09-09 (×2): 25 mg via ORAL
  Filled 2014-09-08: qty 1
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
## 2014-09-08 NOTE — Tx Team (Signed)
Initial Interdisciplinary Treatment Plan   PATIENT STRESSORS: Educational concerns Financial difficulties Medication change or noncompliance Substance abuse   PATIENT STRENGTHS: General fund of knowledge Supportive family/friends Work skills   PROBLEM LIST: Problem List/Patient Goals Date to be addressed Date deferred Reason deferred Estimated date of resolution  "find a place to live" 09/08/2014     "get medication to help with my anxiety and anger issues" 09/08/2014     "work on disability" 09/08/2014                                          DISCHARGE CRITERIA:  Ability to meet basic life and health needs Improved stabilization in mood, thinking, and/or behavior Motivation to continue treatment in a less acute level of care Need for constant or close observation no longer present Safe-care adequate arrangements made Verbal commitment to aftercare and medication compliance  PRELIMINARY DISCHARGE PLAN: Attend aftercare/continuing care group Outpatient therapy Return to previous work or school arrangements  PATIENT/FAMIILY INVOLVEMENT: This treatment plan has been presented to and reviewed with the patient, [name]  The patient has been given the opportunity to ask questions and make suggestions.  Jule SerKent, Thania Woodlief Gail 09/08/2014, 8:05 PM

## 2014-09-08 NOTE — Progress Notes (Signed)
[facility] and given to [place], [place]. [facility] and seems to happy about that. Pt currently denies SI/HI/hallucinations and is contracting for safety.
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
## 2014-09-08 NOTE — Discharge Instructions (Signed)
·   Thank you for allowing us to be involved in your healthcare while [facility] were [facility].   Please note that there have been changes to your home medications.  --> PLEASE LOOK AT YOUR DISCHARGE MEDICATION LIST FOR DETAILS.  Please call your PCP if you have any questions or concerns, or any difficulty getting any of your medications.  Suicide Resources  Who to Call  Call 911  National Suicide Prevention Hotline 1-800-SUICIDE or (800) 318-296-6093273-TALK(8255)  [facility] at [phone]; [phone]) [phone]  More Resources  Suicide Awareness Voices of Education       [phone]        www.save.org  The [employer] on Mental Illness(NAMI)       (800) 950-NAMI        www.nami.org  American Association of Suicidology       616-807-4617(202) [phone]        www.suicidology.org

## 2014-09-08 NOTE — Clinical Social Work Note (Signed)
Patient has been accepted to Behavioral Health  bed assignment: 401-1.   Accepted by Dr Pat KocherJonnalagadda/Cobos.   RN to call report to: [phone] Transportation: Nada [place]   Gina Jennifer Payes, LCSWA [phone]  Psychiatric & Orthopedics (5N 1-16) Clinical Social Worker

## 2014-09-09 ENCOUNTER — Encounter (HOSPITAL_COMMUNITY): Payer: Self-pay | Admitting: Psychiatry

## 2014-09-09 DIAGNOSIS — F4010 Social phobia, unspecified: Secondary | ICD-10-CM

## 2014-09-09 DIAGNOSIS — F4310 Post-traumatic stress disorder, unspecified: Secondary | ICD-10-CM

## 2014-09-09 DIAGNOSIS — F3164 Bipolar disorder, current episode mixed, severe, with psychotic features: Secondary | ICD-10-CM

## 2014-09-09 MED ORDER — TRAZODONE HCL 50 MG PO TABS
50.0000 mg | ORAL_TABLET | Freq: Every day | ORAL | Status: DC
  Administered 2014-09-09 – 2014-09-13 (×5): 50 mg via ORAL
  Filled 2014-09-09 (×3): qty 1
  Filled 2014-09-09 (×3): qty 14
  Filled 2014-09-09 (×2): qty 1

## 2014-09-09 MED ORDER — BENZTROPINE MESYLATE 0.5 MG PO TABS
0.5000 mg | ORAL_TABLET | ORAL | Status: DC
  Administered 2014-09-09 – 2014-09-14 (×10): 0.5 mg via ORAL
  Filled 2014-09-09 (×4): qty 28
  Filled 2014-09-09 (×8): qty 1
  Filled 2014-09-09: qty 28
  Filled 2014-09-09: qty 1
  Filled 2014-09-09: qty 28
  Filled 2014-09-09: qty 1

## 2014-09-09 MED ORDER — HYDROXYZINE HCL 25 MG PO TABS
25.0000 mg | ORAL_TABLET | Freq: Four times a day (QID) | ORAL | Status: DC | PRN
  Filled 2014-09-09: qty 30

## 2014-09-09 MED ORDER — LORAZEPAM 1 MG PO TABS: 1.0000 mg | ORAL_TABLET | ORAL | Status: DC | PRN

## 2014-09-09 MED ORDER — OLANZAPINE 5 MG PO TBDP
5.0000 mg | ORAL_TABLET | ORAL | Status: DC
  Administered 2014-09-09 – 2014-09-14 (×10): 5 mg via ORAL
  Filled 2014-09-09 (×5): qty 1
  Filled 2014-09-09: qty 28
  Filled 2014-09-09 (×2): qty 1
  Filled 2014-09-09: qty 28
  Filled 2014-09-09: qty 1
  Filled 2014-09-09 (×4): qty 28
  Filled 2014-09-09 (×2): qty 1

## 2014-09-09 MED ORDER — CITALOPRAM HYDROBROMIDE 10 MG PO TABS
10.0000 mg | ORAL_TABLET | Freq: Every day | ORAL | Status: DC
  Administered 2014-09-09 – 2014-09-10 (×2): 10 mg via ORAL
  Filled 2014-09-09 (×5): qty 1

## 2014-09-09 MED ORDER — GABAPENTIN 100 MG PO CAPS
100.0000 mg | ORAL_CAPSULE | Freq: Three times a day (TID) | ORAL | Status: DC
  Administered 2014-09-09 – 2014-09-14 (×16): 100 mg via ORAL
  Filled 2014-09-09: qty 1
  Filled 2014-09-09 (×2): qty 42
  Filled 2014-09-09 (×5): qty 1
  Filled 2014-09-09: qty 42
  Filled 2014-09-09 (×3): qty 1
  Filled 2014-09-09: qty 42
  Filled 2014-09-09 (×2): qty 1
  Filled 2014-09-09: qty 42
  Filled 2014-09-09 (×2): qty 1
  Filled 2014-09-09 (×2): qty 42
  Filled 2014-09-09: qty 1
  Filled 2014-09-09: qty 42
  Filled 2014-09-09 (×2): qty 1
  Filled 2014-09-09: qty 42
  Filled 2014-09-09: qty 1

## 2014-09-09 MED ORDER — RISPERIDONE 2 MG PO TBDP: 2.0000 mg | ORAL_TABLET | Freq: Three times a day (TID) | ORAL | Status: DC | PRN

## 2014-09-09 MED ORDER — DIVALPROEX SODIUM 500 MG PO DR TAB
500.0000 mg | DELAYED_RELEASE_TABLET | ORAL | Status: DC
  Administered 2014-09-09 – 2014-09-14 (×10): 500 mg via ORAL
  Filled 2014-09-09: qty 28
  Filled 2014-09-09 (×3): qty 1
  Filled 2014-09-09: qty 28
  Filled 2014-09-09 (×3): qty 1
  Filled 2014-09-09: qty 28
  Filled 2014-09-09: qty 1
  Filled 2014-09-09: qty 28
  Filled 2014-09-09 (×2): qty 1
  Filled 2014-09-09: qty 28
  Filled 2014-09-09: qty 1
  Filled 2014-09-09: qty 28

## 2014-09-09 MED ORDER — ZIPRASIDONE MESYLATE 20 MG IM SOLR: 20.0000 mg | INTRAMUSCULAR | Status: DC | PRN

## 2014-09-09 NOTE — [facility] Group Notes (Signed)
[facility] LCSW Group Therapy  09/09/2014 3:49 PM  Type of Therapy:  Group Therapy  Participation Level:  Active  Participation Quality:  Attentive  Affect:  Depressed and Flat  Cognitive:  Oriented  Insight:  Improving  Engagement in Therapy:  Improving  Modes of Intervention:  Confrontation, Discussion, Education, Exploration, Problem-solving, Rapport Building, Socialization and Support  Summary of Progress/Problems:  Finding Balance in Life. Today's group focused on defining balance in one's own words, identifying things that can knock one off balance, and exploring healthy ways to maintain balance in life. Group members were asked to provide an example of a time when they felt off balance, describe how they handled that situation,and process healthier ways to regain balance in the future. Group members were asked to share the most important tool for maintaining balance that they [facility] and how they plan to apply this method after discharge. [name] was attentive and engaged during today's processing group. He shared that he currently feels off balance but is feeling better than he did upon admission because "they got me on better medication now." [name] shared that he is diagnosed with bipolar disorder and feels paranoid that people are talking about him and has trouble managing his emotions at times. He shared that medication and talking out his fears and issues with others helps him to cope effectively with these negative Sx. Pt reports that he does not have housing and this is a big stressor for him. CSW, pt, and other group members problem solved with pt about resources in the community and options for him upon d/c. ([facility], shelters, family that he has not reached out to yet, etc).    Smart, [name] LCSWA  09/09/2014, 3:49 PM

## 2014-09-09 NOTE — Progress Notes (Signed)
Pt attended spiritual care group on grief and loss facilitated by chaplain Burnis KingfisherMatthew Stalnaker and counseling intern Tristan Malcom Selmer. Group opened with brief discussion and psycho-social ed around grief and loss in relationships and in relation to self - identifying life patterns, circumstances, changes that cause losses. Established group norm of speaking from own life experience. Group goal of establishing open and affirming space for members to share loss and experience with grief, normalize grief experience and provide psycho social education and grief support. Group drew on narrative and Alderian therapeutic modalities.   [name] was present through out group but did not contribute to group discussion.   [name] Counseling Intern

## 2014-09-09 NOTE — Progress Notes (Signed)
Patient ID: Tristan DameMatthew J Holsonback, male   DOB: [birth year], 19 y.o.   MRN: [id number]   The focus of this group is to educate the patient on the purpose and policies of crisis stabilization and provide a format to answer questions about their admission. The group details unit policies and expectations of patients while admitted.  Patient attended 0900 nurse education orientation group this morning. Patient actively participated, appropriate affect, alert, appropriate insight and engagement. Today patient will work on 3 goals for discharge.

## 2014-09-09 NOTE — Progress Notes (Signed)
D: Patient denies SI/HI and A/V hallucinations; patient reports sleep is good; reports appetite is fair; reports energy level is normal ; reports ability to concentrate is good; rates depression as 4/10; rates hopelessness 5/10; rates anxiety as 2/10;   A: Monitored q 15 minutes; patient encouraged to attend groups; patient educated about medications; patient given medications per physician orders; patient encouraged to express feelings and/or concerns  R: Patient is cooperative and had some anxiety this morning; patient is pleasant but can be irritable; patient's interaction with staff and peers is appropriate ; patient was able to set goal to talk with staff 1:1 when having feelings of SI; patient is taking medications as prescribed and tolerating medications; patient is attending all groups

## 2014-09-09 NOTE — [facility] Suicide Risk Assessment (Signed)
   Nursing information obtained from:    Demographic factors:    Current Mental Status:    Loss Factors:    Historical Factors:    Risk Reduction Factors:    Total Time spent with patient: 30 minutes  CLINICAL FACTORS:   Previous Psychiatric Diagnoses and Treatments Medical Diagnoses and Treatments/Surgeries  Psychiatric Specialty Exam: Physical Exam  ROS  Blood pressure 145/95, pulse 91, temperature 98.4 F (36.9 C), temperature source Oral, resp. rate 20, height 5' 8.5" (1.74 m), weight 66.452 kg (146 lb 8 oz), SpO2 0 %.Body mass index is 21.95 kg/(m^2).              Please see H&P.                                   Sleep:  Number of Hours: 6.75    SUICIDE RISK:   Severe:  Frequent, intense, and enduring suicidal ideation, specific plan, no subjective intent, but some objective markers of intent (i.e., choice of lethal method), the method is accessible, some limited preparatory behavior, evidence of impaired self-control, severe dysphoria/symptomatology, multiple risk factors present, and few if any protective factors, particularly a lack of social support.  PLAN OF CARE:Please see H&P.   I certify that inpatient services furnished can reasonably be expected to improve the patient's condition.  Nasiah Lehenbauer MD 09/09/2014, 10:59 AM
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
## 2014-09-09 NOTE — Progress Notes (Signed)
Adult Psychoeducational Group Note  Date:  09/09/2014 Time:  9:33 PM  Group Topic/Focus:  Wrap-Up Group:   The focus of this group is to help patients review their daily goal of treatment and discuss progress on daily workbooks.  Participation Level:  Active  Participation Quality:  Appropriate and Attentive  Affect:  Appropriate  Cognitive:  Appropriate  Insight: Appropriate  Engagement in Group:  Engaged  Modes of Intervention:  Discussion  Additional Comments:  Pts discussed the theme for the day "Recovery." Pt was able to come up with 2 things he can do to help maintain his recovery: 1) focus more on making himself happy and not worry so much about others 2) find new/more hobbies, and take his meds.  Caswell CorwinOwen, Jani Ploeger C 09/09/2014, 9:33 PM
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
## 2014-09-09 NOTE — Progress Notes (Signed)
  D: Pt was pleasant. Writer observed pt earlier in the shift going into the dayroom on [address]. Pt wasn't observed by the writer interacting with his peers, but was calm. However, pt wouldn't elaborate or engage the Clinical research associatewriter in conversation. Pt voiced no questions or concerns at the time of his assessment.   A:  Support and encouragement was offered. 15 min checks continued for safety.  R: Pt remains safe.
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
## 2014-09-10 DIAGNOSIS — F3130 Bipolar disorder, current episode depressed, mild or moderate severity, unspecified: Secondary | ICD-10-CM | POA: Diagnosis present

## 2014-09-10 DIAGNOSIS — F4310 Post-traumatic stress disorder, unspecified: Secondary | ICD-10-CM | POA: Diagnosis present

## 2014-09-10 DIAGNOSIS — F4010 Social phobia, unspecified: Secondary | ICD-10-CM | POA: Diagnosis present

## 2014-09-10 LAB — LIPID PANEL
CHOL/HDL RATIO: 2.7 ratio
Cholesterol: 139 mg/dL (ref 0–200)
HDL: 51 mg/dL (ref >39)
LDL CALC: 78 mg/dL (ref 0–99)
Triglycerides: 48 mg/dL (ref <150)
VLDL: 10 mg/dL (ref 0–40)

## 2014-09-10 LAB — HEMOGLOBIN A1C
Hgb A1c MFr Bld: 5.4 % (ref <5.7)
Mean Plasma Glucose: 108 mg/dL (ref <117)

## 2014-09-10 LAB — TSH: TSH: 2.428 u[IU]/mL (ref 0.350–4.500)

## 2014-09-10 MED ORDER — CITALOPRAM HYDROBROMIDE 20 MG PO TABS
20.0000 mg | ORAL_TABLET | Freq: Every day | ORAL | Status: DC
  Administered 2014-09-11 – 2014-09-14 (×4): 20 mg via ORAL
  Filled 2014-09-10 (×2): qty 14
  Filled 2014-09-10 (×2): qty 1
  Filled 2014-09-10: qty 14
  Filled 2014-09-10 (×2): qty 1

## 2014-09-10 NOTE — [facility] Group Notes (Signed)
[facility] LCSW Group Therapy  09/10/2014 2:36 PM  Type of Therapy:  Group Therapy  Participation Level:  Active  Participation Quality:  Attentive  Affect:  Appropriate  Cognitive:  Oriented  Insight:  Improving  Engagement in Therapy:  Engaged  Modes of Intervention:  Discussion, Education, Exploration, Limit-setting, Problem-solving, Rapport Building, Socialization and Support  Summary of Progress/Problems: MHA Speaker came to talk about his personal journey with substance abuse and addiction. The pt processed ways by which to relate to the speaker. MHA speaker provided handouts and educational information pertaining to groups and services [facility] by [facility]. Pt reported that the speaker's story made him tearful and think about his father's life and experiences with substance abuse.    Smart, Nirvan Laban LCSWA 09/10/2014, 2:36 PM

## 2014-09-10 NOTE — [facility] Group Notes (Signed)
[facility]   09/10/2014 10:38 AM  Participation Quality:  Appropriate   Mood/Affect:  Appropriate  Depression Rating:  0  Anxiety Rating:  0  Thoughts of Suicide:  No Will you contract for safety?   NA  Current AVH:  No  Plan for Discharge/Comments:  Pt denies SI and reports that he is feeling good today. "I found out that I have 45 days left on my lease and so I can go back home." Pt reports that he is interested in referral to [facility] rather than [facility]. CSW assessing.   Transportation Means: bus pass needed   Supports: foster mom/nana and aunt. Pt also has bf   Smart, OncologistHeather LCSWA
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
## 2014-09-10 NOTE — Progress Notes (Signed)
Patient's blood pressure checked at 1632 hrs after being evaluated after breakfast and lunch. Dr. Elvera MariaS. Eappen was notified of vital signs at all instances during this day and order was given to check every 6 hours at this time. No other orders given at this time. Will continue to monitor patient at this time.

## 2014-09-10 NOTE — Progress Notes (Signed)
D: Patient denies SI/HI and A/V hallucinations; patient reports sleep is good; patient did receive medication for sleep; reports appetite is good; reports energy level is normal ; reports ability to concentrate is good; rates depression as 1/10; rates hopelessness 0/10; rates anxiety as 0/10;  A: Monitored q 15 minutes; patient encouraged to attend groups; patient educated about medications; patient given medications per physician orders; patient encouraged to express feelings and/or concerns  R: Patient is cooperative and pleasant; patient is appropriate to circumstances;  ; patient's interaction with staff and peers is appropriate to circumstances.; patient was able to set goal to talk with staff 1:1 when having feelings of SI; patient is taking medications as prescribed and tolerating medications; patient is attending all groups

## 2014-09-11 NOTE — Progress Notes (Signed)
D: client visible on the unit, interact appropriately with staff and peers. Client animated, smiling at times. Client reports "I OD on my medicine" notes stressor as "problems with roommates" "they'll be gone when I get back" A: Writer introduced self to client, encouraged him to verbalize and concerns. Staff will encouraged group, will monitor q4715min for safety. R: Client is safe on the unit, did not attend group.

## 2014-09-11 NOTE — [facility] Group Notes (Signed)
[facility] LCSW Group Therapy 07/23/2015 1:15 PM Type of Therapy: Group Therapy Participation Level: Active  Participation Quality: Attentive, Sharing and Supportive  Affect: Appropriate  Cognitive: Alert and Oriented  Insight: Developing/Improving and Engaged  Engagement in Therapy: Developing/Improving and Engaged  Modes of Intervention: Activity, Clarification, Confrontation, Discussion, Education, Exploration, Limit-setting, Orientation, Problem-solving, Rapport Building, Reality Testing, Socialization and Support  Summary of Progress/Problems: Patient was attentive and engaged with speaker from [employer]. Patient was attentive to speaker while they shared their story of dealing with mental health and overcoming it. Patient expressed interest in their programs and services and received information on their agency. Patient processed ways they can relate to the speaker.   Valincia Touch, MSW, LCSWA Clinical Social Worker [facility] [phone]   

## 2014-09-11 NOTE — Progress Notes (Signed)
D: Patient is alert and oriented. Pt's mood and affect is appropriate to circumstance and pleasant. Pt's interaction with RN is child-like. Pt denies depression and hopelessness, pt rates his anxiety 2/10. Pt reports his goal for the day is to "continue to take my meds and attend all groups." Pt denies SI/HI and AVH. Pt is attending groups. A: Encouragement/Support provided to pt. Scheduled medications administered per providers orders (See MAR). 15 minute checks continued per protocol for patient safety.  R: Patient cooperative and receptive to nursing interventions.

## 2014-09-11 NOTE — Progress Notes (Signed)
Recreation Therapy Notes  Animal-Assisted Activity/Therapy (AAA/T) Program Checklist/Progress Notes Patient Eligibility Criteria Checklist & Daily Group note for Rec Tx Intervention  Date: 01.21.2016 Time: 2:45pm [facility] [address]    AAA/T Program Assumption of Risk Form signed by Patient/ or Parent Legal Guardian yes  Patient is free of allergies or sever asthma yes  Patient reports no fear of animals yes  Patient reports no history of cruelty to animals yes  Patient understands his/her participation is voluntary yes  Patient washes hands before animal contact yes  Patient washes hands after animal contact yes  Behavioral Response: Appropriate   Education: Hand Washing, Appropriate Animal Interaction   Education Outcome: Acknowledges education.   Clinical Observations/Feedback: Patient actively engaged with therapy dog, petting him appropriately and feeding him a treat in exchange for completion of basic obedience commands.   [name], LRT/CTRS  [name], [name] 09/11/2014 5:13 PM
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
## 2014-09-11 NOTE — Plan of Care (Signed)
Problem: Alteration in mood Goal: LTG-Patient reports reduction in suicidal thoughts (Patient reports reduction in suicidal thoughts and is able to verbalize a safety plan for whenever patient is feeling suicidal)  Outcome: Progressing Patient denies having suicidal thoughts today. Alena BillsGuthrie, Aloys Hupfer A, RN   Problem: Ineffective individual coping Goal: STG: Patient will remain free from self harm Outcome: Progressing Patient remains free from self harm. 15 minute checks continued per protocol for patient safety. Alena BillsGuthrie, Feras Gardella A, RN   Problem: Diagnosis: Increased Risk For Suicide Attempt Goal: STG-Patient Will Attend All Groups On The Unit Outcome: Progressing Patient is attending all unit groups and participates appropriately. Alena BillsGuthrie, Nickson Middlesworth A, RN 09/11/2014 2:55 PM

## 2014-09-11 NOTE — [facility] Group Notes (Signed)
[facility] Group Notes:  (Nursing/MHT/Case Management/Adjunct)  Date:  09/11/2014  Time:  0900am  Type of Therapy:  Nurse Education  Participation Level:  Active  Participation Quality:  Appropriate and Attentive  Affect:  Appropriate  Cognitive:  Alert and Appropriate  Insight:  Appropriate and Good  Engagement in Group:  Engaged  Modes of Intervention:  Discussion, Education, Orientation and Support  Summary of Progress/Problems: Patient attended group and responded appropriately stating his goal for the day is to "stay positive." Pt stated his long term goal is to get his "own spot" in regards to housing.  Lendell CapriceGuthrie, Deberah Adolf A 09/11/2014, 12:40 PM

## 2014-09-12 NOTE — Plan of Care (Signed)
Problem: Alteration in mood Goal: LTG-Patient reports reduction in suicidal thoughts (Patient reports reduction in suicidal thoughts and is able to verbalize a safety plan for whenever patient is feeling suicidal)  Outcome: Progressing Patient denies having any suicidal thoughts today.  Problem: Ineffective individual coping Goal: STG: Patient will remain free from self harm Outcome: Progressing Patient remains free from self harm. 15 minute checks continued per protocol for patient safety.   Problem: Diagnosis: Increased Risk For Suicide Attempt Goal: LTG-Patient Will Report Improved Mood and Deny Suicidal LTG (by discharge) Patient will report improved mood and deny suicidal ideation.  Outcome: Progressing Patient reports his mood today is "good" and denies any suicidal thoughts. Goal: STG-Patient Will Attend All Groups On The Unit Outcome: Progressing Patient is attending all unit groups. Goal: STG-Patient Will Comply With Medication Regime Outcome: Progressing Patient has adhered to medication regimen today with ease.

## 2014-09-12 NOTE — [facility] Suicide Risk Assessment (Signed)
[facility] INPATIENT:  Family/Significant Other Suicide Prevention Education  Suicide Prevention Education:  Contact Attempts: Jonita AlbeeBarbara Mayfield, former foster mother, has been identified by the patient as the family member/significant other with whom the patient will be residing, and identified as the person(s) who will aid the patient in the event of a mental health crisis.  With written consent from the patient, two attempts were made to provide suicide prevention education, prior to and/or following the patient's discharge.  We were unsuccessful in providing suicide prevention education.  A suicide education pamphlet was given to the patient to share with family/significant other.  Date and time of first attempt: 09/12/14/11:30 AM, no answer, did not leave message Date and time of second attempt: 09/13/2014 at 1:50 PM again to [facility] at [phone], no answer, no voicemail  Sallee LangeCunningham, Anne C 09/12/2014, 11:36 AM  And  Carney BernCatherine C Harrill, LCSW  09/13/2014 1:50 PM

## 2014-09-12 NOTE — Progress Notes (Signed)
D: Patient is alert and oriented. Pt's mood and affect is "good," calm, pleasant, and congruent with circumstance. Pt's eye contact is fair. Pt reports his goal for the day is to "continue to do group and be ready for discharge tomorrow." Pt rates his depression and anxiety 2/10. Pt denies hopelessness. Pt denies SI/HI and AVH at this time. Pt reports headache this morning 5/10 with no relief from PRN medication. Pt is attending groups. Pt complains of "eye twitching" this afternoon, observable muscle spasm noted in right eye lid, which decreased with rest. A: Pt encouraged to rest eye to see if muscle spasms diminish. PRN medication administered for pain per providers orders (See MAR). Cold pack offered for persistent headache, pt refused, will offer/administered PRN medication as pt requests and order parameters allow. Scheduled medications administered per providers orders (See MAR). 15 minute checks continued per protocol for patient safety.  R: Patient cooperative and receptive to nursing interventions. Pt remains safe.
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
## 2014-09-12 NOTE — Progress Notes (Signed)
[facility]  09/12/2014 8:45 AM  Participation Quality: Alert, Appropriate and Oriented  Mood/Affect: Apprpriate  Depression Rating: 2  Anxiety Rating: 2  Thoughts of Suicide: Pt denies SI/HI  Will you contract for safety? Yes  Current AVH: Pt denies  Plan for Discharge/Comments: Pt attended discharge planning group and actively participated in group. CSW provided pt with today's workbook. Pt reports feeling stable for discharge tomorrow and is agreeable with aftercare plan.   Transportation Means: Pt reports access to transportation  Supports: No supports mentioned at this time  Chad CordialLauren Carter, LCSWA 09/12/2014 9:46 AM

## 2014-09-12 NOTE — Progress Notes (Signed)
[facility] Group Notes:  (Nursing/MHT/Case Management/Adjunct)  Date:  09/12/2014  Time:  11:59 PM  Type of Therapy:  Psychoeducational Skills  Participation Level:  Minimal  Participation Quality:  Attentive  Affect:  Flat  Cognitive:  Lacking  Insight:  Limited  Engagement in Group:  Limited  Modes of Intervention:  Education  Summary of Progress/Problems: The patient attended the Wrap-Up group on the 400 hallway and had little to say except that he had a good day. As a theme for the day, his coping skill is to stay quiet rather than talking as much.   Hazle CocaGOODMAN, Breslyn Abdo S 09/12/2014, 11:59 PM

## 2014-09-12 NOTE — Progress Notes (Signed)
D: Client is visible on the unit, smiling, pleasant, reports of going home "If I had to go tomorrow, don't know if I go anywhere" Client reports of living situation "depressed about going back there, no love there" A: Writer provided emotional support encouraged client to make the best of living situation until the end of the month when roommates will be moving out. Encouraged client to find options outside of the home to make himself busy i.e gym, family, or friends. Staff will monitor q2715min for safety. R: client is safe on the unit, attended group. "I been thinking about joining [employer] cause its only ten dollars a month"

## 2014-09-13 LAB — VALPROIC ACID LEVEL: Valproic Acid Lvl: 39.8 ug/mL — ABNORMAL LOW (ref 50.0–100.0)

## 2014-09-13 MED ORDER — OLANZAPINE 5 MG PO TBDP: 5.0000 mg | ORAL_TABLET | ORAL | Status: DC

## 2014-09-13 MED ORDER — GABAPENTIN 100 MG PO CAPS: 100.0000 mg | ORAL_CAPSULE | Freq: Three times a day (TID) | ORAL | Status: DC

## 2014-09-13 MED ORDER — DIVALPROEX SODIUM 500 MG PO DR TAB: 500.0000 mg | DELAYED_RELEASE_TABLET | ORAL | Status: DC

## 2014-09-13 MED ORDER — BENZTROPINE MESYLATE 0.5 MG PO TABS: 0.5000 mg | ORAL_TABLET | ORAL | Status: DC

## 2014-09-13 MED ORDER — CITALOPRAM HYDROBROMIDE 20 MG PO TABS: 20.0000 mg | ORAL_TABLET | Freq: Every day | ORAL | Status: DC

## 2014-09-13 MED ORDER — TRAZODONE HCL 50 MG PO TABS: 50.0000 mg | ORAL_TABLET | Freq: Every day | ORAL | Status: DC

## 2014-09-13 MED ORDER — HYDROXYZINE HCL 25 MG PO TABS: 25.0000 mg | ORAL_TABLET | Freq: Four times a day (QID) | ORAL | Status: DC | PRN
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
## 2014-09-13 NOTE — Progress Notes (Signed)
Patient ID: Tristan DameMatthew J Spoon, male   DOB: [birth year], 19 y.o.   MRN: [id number]   D: Pt has been appropriate on the unit today, he has attended all groups and has engaged in treatment. Pt was for discharge today, but reported to this writer that due to the weather he could not be discharged. Dr. Lolly MustacheArfeen initially put in discharge orders for patient, however once he seen patient he reported that the patient seemed to be more anxious so his discharge was cancelled. Pt reported that his depression was a 4, and that his hopelessness was a 5. Pt reported being negative SI/HI, no AH/VH noted. A: 15 min checks continued for patient safety. R: Pt safety maintained.

## 2014-09-13 NOTE — Progress Notes (Signed)
D)  Has been out and about on the unit, participating in the milieu, and spent time on the 400 hall watching tv with peers.  Attended group, but was ready to go to bed after snack, and came to the med window.  Has been compliant with meds and program tonight, denies thoughts of self harm and contracts for safety.   A)  Will continue to monitor for safety, continue POC R)  Safety maintained.

## 2014-09-13 NOTE — [facility] Group Notes (Signed)
[facility] LCSW Group Therapy Note  09/13/2014 / 11:15 AM  Type of Therapy and Topic:  Group Therapy: Avoiding Self-Sabotaging and Enabling Behaviors  Participation Level:  None  Therapeutic Goals: 1. Patient will identify one obstacle that relates to self-sabotage and enabling behaviors 2. Patient will identify one personal self-sabotaging or enabling behavior they did prior to admission 3. Patient able to establish a plan to change the above identified behavior they did prior to admission:  4. Patient will demonstrate ability to communicate their needs through discussion and/or role plays.   Summary of Patient Progress: The main focus of today's process group was to discuss what "self-sabotage" means and use Motivational Interviewing to discuss what benefits, negative or positive, were involved in a self-identified self-sabotaging behavior. We then talked about reasons the patient may want to change the behavior and current desire to change. The patient identified with no self sabotaging behavior. He was unengaged in group process as evidenced by his drowsiness and physical detachment from group as he laid curled in fetal position on settee.      Therapeutic Modalities:   Cognitive Behavioral Therapy Person-Centered Therapy Motivational Interviewing   Carney Bernatherine C Harrill, LCSW

## 2014-09-14 NOTE — Progress Notes (Signed)
Patient ID: Tristan DameMatthew J Rodriguez, male   DOB: Sep 12, 1995, 19 y.o.   MRN: [id number]   Pt was discharged home with a cab voucher, pt reported that he felt much better today and that he was ready for discharge. Pt reported being negative SI/HI, and no AH/VH noted at time of discharge.
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
## 2014-09-14 NOTE — BHH Group Notes (Signed)
.  BHH LCSW Group Therapy  09/14/2014   1: 15 PM   Type of Therapy:  Group Therapy  Participation Level:  Active  Participation Quality:  Appropriate and Attentive  Affect:  Appropriate, Calm  Cognitive:  Alert and Appropriate  Insight:  Developing/Improving and Engaged  Engagement in Therapy:  Developing/Improving and Engaged  Modes of Intervention:  Clarification, Confrontation, Discussion, Education, Exploration, Limit-setting, Orientation, Problem-solving, Rapport Building, Dance movement psychotherapisteality Testing, Socialization and Support  Summary of Progress/Problems: The main focus of today's process group was to identify the patient's current support system and decide on other supports that can be put in place.  An emphasis was placed on using counselor, doctor, therapy groups, 12-step groups, and problem-specific support groups to expand supports, as well as doing something different than has been done before.  Pt appeared to actively listen to group discussion but did not share anything.     Reyes IvanChelsea Horton, LCSW 09/14/2014 2:58 PM
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
## 2014-09-14 NOTE — Progress Notes (Signed)
BH Group Notes:  (Nursing/MT/Case Management/Adjunct)  Date:  09/14/2014  Time:  12:21 AM  Type of Therapy:  Psychoeducational Skills  Participation Level:  Minimal  Participation Quality:  Inattentive  Affect:  Flat  Cognitive:  Lacking  Insight:  Lacking  Engagement in Group:  Lacking  Modes of Intervention:  Education  Summary of Progress/Problems: The presented himself in the same manner as he did the previous evening,i.e.. He had very little to share. He states that he had a good day since he went to the gym but refused to share any additional details. As a theme for the day, his support system will be comprised of his grandmother.   Coreen Shippee S 09/14/2014, 12:21 AM

## 2014-09-15 ENCOUNTER — Encounter (HOSPITAL_COMMUNITY): Payer: Self-pay | Admitting: *Deleted

## 2014-09-15 ENCOUNTER — Inpatient Hospital Stay (HOSPITAL_COMMUNITY)
Admission: AD | Admit: 2014-09-15 | Discharge: 2014-09-23 | DRG: 881 | Disposition: A | Discharge status: Home / Self Care | Payer: Federal, State, Local not specified - Other | Source: Intra-hospital | Attending: Psychiatry | Admitting: Psychiatry

## 2014-09-15 ENCOUNTER — Emergency Department (HOSPITAL_COMMUNITY)
Admission: EM | Admit: 2014-09-15 | Discharge: 2014-09-15 | Disposition: A | Discharge status: Other Acute Inpatient Hospital | Payer: Medicaid Other | Attending: Emergency Medicine | Admitting: Emergency Medicine

## 2014-09-15 ENCOUNTER — Encounter (HOSPITAL_COMMUNITY): Payer: Self-pay | Admitting: Emergency Medicine

## 2014-09-15 DIAGNOSIS — F410 Panic disorder [episodic paroxysmal anxiety] without agoraphobia: Secondary | ICD-10-CM | POA: Diagnosis present

## 2014-09-15 DIAGNOSIS — F329 Major depressive disorder, single episode, unspecified: Secondary | ICD-10-CM | POA: Diagnosis present

## 2014-09-15 DIAGNOSIS — Z590 Homelessness: Secondary | ICD-10-CM | POA: Diagnosis not present

## 2014-09-15 DIAGNOSIS — F419 Anxiety disorder, unspecified: Secondary | ICD-10-CM

## 2014-09-15 DIAGNOSIS — F1210 Cannabis abuse, uncomplicated: Secondary | ICD-10-CM | POA: Insufficient documentation

## 2014-09-15 DIAGNOSIS — Z8709 Personal history of other diseases of the respiratory system: Secondary | ICD-10-CM | POA: Insufficient documentation

## 2014-09-15 DIAGNOSIS — Z9114 Patient's other noncompliance with medication regimen: Secondary | ICD-10-CM | POA: Diagnosis present

## 2014-09-15 DIAGNOSIS — G8929 Other chronic pain: Secondary | ICD-10-CM | POA: Diagnosis present

## 2014-09-15 DIAGNOSIS — R45851 Suicidal ideations: Secondary | ICD-10-CM | POA: Diagnosis present

## 2014-09-15 DIAGNOSIS — F4010 Social phobia, unspecified: Secondary | ICD-10-CM | POA: Diagnosis present

## 2014-09-15 DIAGNOSIS — F1010 Alcohol abuse, uncomplicated: Secondary | ICD-10-CM | POA: Diagnosis present

## 2014-09-15 DIAGNOSIS — Z87442 Personal history of urinary calculi: Secondary | ICD-10-CM | POA: Insufficient documentation

## 2014-09-15 DIAGNOSIS — Z87891 Personal history of nicotine dependence: Secondary | ICD-10-CM | POA: Insufficient documentation

## 2014-09-15 DIAGNOSIS — Z79899 Other long term (current) drug therapy: Secondary | ICD-10-CM | POA: Insufficient documentation

## 2014-09-15 DIAGNOSIS — F32A Depression, unspecified: Secondary | ICD-10-CM

## 2014-09-15 DIAGNOSIS — Z62810 Personal history of physical and sexual abuse in childhood: Secondary | ICD-10-CM | POA: Diagnosis present

## 2014-09-15 DIAGNOSIS — G4700 Insomnia, unspecified: Secondary | ICD-10-CM | POA: Diagnosis present

## 2014-09-15 DIAGNOSIS — F4310 Post-traumatic stress disorder, unspecified: Secondary | ICD-10-CM | POA: Diagnosis present

## 2014-09-15 LAB — CBC
HEMATOCRIT: 41.3 % (ref 39.0–52.0)
HEMOGLOBIN: 13.4 g/dL (ref 13.0–17.0)
MCH: 29.7 pg (ref 26.0–34.0)
MCHC: 32.4 g/dL (ref 30.0–36.0)
MCV: 91.6 fL (ref 78.0–100.0)
Platelets: 300 10*3/uL (ref 150–400)
RBC: 4.51 MIL/uL (ref 4.22–5.81)
RDW: 13.8 % (ref 11.5–15.5)
WBC: 5.6 10*3/uL (ref 4.0–10.5)

## 2014-09-15 LAB — RAPID URINE DRUG SCREEN, HOSP PERFORMED
Amphetamines: NOT DETECTED
BENZODIAZEPINES: NOT DETECTED
Barbiturates: NOT DETECTED
Cocaine: NOT DETECTED
OPIATES: NOT DETECTED
Tetrahydrocannabinol: POSITIVE — AB

## 2014-09-15 LAB — COMPREHENSIVE METABOLIC PANEL
ALBUMIN: 3.9 g/dL (ref 3.5–5.2)
ALK PHOS: 91 U/L (ref 39–117)
ALT: 12 U/L (ref 0–53)
AST: 14 U/L (ref 0–37)
Anion gap: 7 (ref 5–15)
BILIRUBIN TOTAL: 0.2 mg/dL — AB (ref 0.3–1.2)
BUN: 7 mg/dL (ref 6–23)
CO2: 25 mmol/L (ref 19–32)
Calcium: 9.1 mg/dL (ref 8.4–10.5)
Chloride: 110 mmol/L (ref 96–112)
Creatinine, Ser: 0.76 mg/dL (ref 0.50–1.35)
GLUCOSE: 117 mg/dL — AB (ref 70–99)
POTASSIUM: 4.1 mmol/L (ref 3.5–5.1)
SODIUM: 142 mmol/L (ref 135–145)
Total Protein: 6.9 g/dL (ref 6.0–8.3)

## 2014-09-15 LAB — ACETAMINOPHEN LEVEL: Acetaminophen (Tylenol), Serum: <10 ug/mL — ABNORMAL LOW (ref 10–30)

## 2014-09-15 LAB — ETHANOL

## 2014-09-15 LAB — SALICYLATE LEVEL: Salicylate Lvl: <4 mg/dL (ref 2.8–20.0)

## 2014-09-15 MED ORDER — ACETAMINOPHEN 325 MG PO TABS: 650.0000 mg | ORAL_TABLET | ORAL | Status: DC | PRN

## 2014-09-15 MED ORDER — ACETAMINOPHEN 325 MG PO TABS
650.0000 mg | ORAL_TABLET | Freq: Four times a day (QID) | ORAL | Status: DC | PRN
  Administered 2014-09-16 – 2014-09-22 (×8): 650 mg via ORAL
  Filled 2014-09-15 (×8): qty 2

## 2014-09-15 MED ORDER — LORAZEPAM 1 MG PO TABS: 1.0000 mg | ORAL_TABLET | Freq: Three times a day (TID) | ORAL | Status: DC | PRN

## 2014-09-15 MED ORDER — IBUPROFEN 200 MG PO TABS: 600.0000 mg | ORAL_TABLET | Freq: Three times a day (TID) | ORAL | Status: DC | PRN

## 2014-09-15 MED ORDER — MAGNESIUM HYDROXIDE 400 MG/5ML PO SUSP: 30.0000 mL | Freq: Every day | ORAL | Status: DC | PRN

## 2014-09-15 MED ORDER — ALUM & MAG HYDROXIDE-SIMETH 200-200-20 MG/5ML PO SUSP: 30.0000 mL | ORAL | Status: DC | PRN

## 2014-09-15 MED ORDER — DIVALPROEX SODIUM 500 MG PO DR TAB
500.0000 mg | DELAYED_RELEASE_TABLET | ORAL | Status: DC
  Administered 2014-09-15 – 2014-09-23 (×16): 500 mg via ORAL
  Filled 2014-09-15 (×3): qty 1
  Filled 2014-09-15: qty 28
  Filled 2014-09-15: qty 1
  Filled 2014-09-15: qty 28
  Filled 2014-09-15 (×14): qty 1

## 2014-09-15 MED ORDER — ONDANSETRON HCL 4 MG PO TABS: 4.0000 mg | ORAL_TABLET | Freq: Three times a day (TID) | ORAL | Status: DC | PRN

## 2014-09-15 MED ORDER — CITALOPRAM HYDROBROMIDE 20 MG PO TABS
20.0000 mg | ORAL_TABLET | Freq: Every day | ORAL | Status: DC
  Administered 2014-09-15 – 2014-09-22 (×8): 20 mg via ORAL
  Filled 2014-09-15 (×2): qty 1
  Filled 2014-09-15: qty 14
  Filled 2014-09-15 (×8): qty 1

## 2014-09-15 MED ORDER — ZOLPIDEM TARTRATE 5 MG PO TABS: 5.0000 mg | ORAL_TABLET | Freq: Every evening | ORAL | Status: DC | PRN

## 2014-09-15 MED ORDER — GABAPENTIN 100 MG PO CAPS
100.0000 mg | ORAL_CAPSULE | Freq: Three times a day (TID) | ORAL | Status: DC
  Administered 2014-09-16 – 2014-09-17 (×4): 100 mg via ORAL
  Filled 2014-09-15 (×8): qty 1

## 2014-09-15 MED ORDER — DIVALPROEX SODIUM 500 MG PO DR TAB: 500.0000 mg | DELAYED_RELEASE_TABLET | ORAL | Status: DC

## 2014-09-15 MED ORDER — HYDROXYZINE HCL 25 MG PO TABS: 25.0000 mg | ORAL_TABLET | Freq: Four times a day (QID) | ORAL | Status: DC | PRN

## 2014-09-15 MED ORDER — TRAZODONE HCL 50 MG PO TABS: 50.0000 mg | ORAL_TABLET | Freq: Every day | ORAL | Status: DC

## 2014-09-15 MED ORDER — CITALOPRAM HYDROBROMIDE 20 MG PO TABS
20.0000 mg | ORAL_TABLET | Freq: Every day | ORAL | Status: DC
  Filled 2014-09-15: qty 1

## 2014-09-15 MED ORDER — OLANZAPINE 5 MG PO TBDP
5.0000 mg | ORAL_TABLET | ORAL | Status: DC
  Administered 2014-09-15 – 2014-09-16 (×2): 5 mg via ORAL
  Filled 2014-09-15 (×7): qty 1

## 2014-09-15 MED ORDER — GABAPENTIN 100 MG PO CAPS
100.0000 mg | ORAL_CAPSULE | Freq: Three times a day (TID) | ORAL | Status: DC
  Administered 2014-09-15: 100 mg via ORAL
  Filled 2014-09-15: qty 1

## 2014-09-15 MED ORDER — OLANZAPINE 5 MG PO TBDP: 5.0000 mg | ORAL_TABLET | ORAL | Status: DC

## 2014-09-15 MED ORDER — HYDROXYZINE HCL 25 MG PO TABS
25.0000 mg | ORAL_TABLET | Freq: Four times a day (QID) | ORAL | Status: DC | PRN
  Administered 2014-09-16 – 2014-09-21 (×14): 25 mg via ORAL
  Filled 2014-09-15 (×14): qty 1
  Filled 2014-09-15: qty 20

## 2014-09-15 MED ORDER — BENZTROPINE MESYLATE 1 MG PO TABS: 0.5000 mg | ORAL_TABLET | ORAL | Status: DC

## 2014-09-15 MED ORDER — BENZTROPINE MESYLATE 0.5 MG PO TABS
0.5000 mg | ORAL_TABLET | ORAL | Status: DC
  Administered 2014-09-15 – 2014-09-17 (×4): 0.5 mg via ORAL
  Filled 2014-09-15 (×7): qty 1

## 2014-09-15 MED ORDER — TRAZODONE HCL 50 MG PO TABS
50.0000 mg | ORAL_TABLET | Freq: Every day | ORAL | Status: DC
  Administered 2014-09-15 – 2014-09-22 (×8): 50 mg via ORAL
  Filled 2014-09-15 (×8): qty 1
  Filled 2014-09-15: qty 14
  Filled 2014-09-15 (×3): qty 1

## 2014-09-15 MED ORDER — NICOTINE 21 MG/24HR TD PT24: 21.0000 mg | MEDICATED_PATCH | Freq: Every day | TRANSDERMAL | Status: DC
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
## 2014-09-15 NOTE — ED Notes (Signed)
Patient states "I don't trust myself. I have money in the bank and I'm trying to stay sober.  I had suicidal thoughts last night and this morning, but not now."  Support offered.

## 2014-09-15 NOTE — BH Assessment (Signed)
Per Renata Capriceonrad, NP - patient meets criteria for inpatient hospitalization.  Per Minerva AreolaEric [facility] Dr. Dub MikesLugo is the accepting doctor.  The nurse will arrange transportation through [place].  [facility].

## 2014-09-15 NOTE — BH Assessment (Signed)
[facility]  Per Thurman CoyerEric Kaplan, RN, [facility] Rm [facility].  Pt signed Voluntary Admission and Consent for Treatment, as well as Consent to Release Information to [facility], where he has an intake appointment scheduled in the near future.  Signed [facility].  Pt's nurse, Carlisle BeersLuann, has been notified.  She agrees to send original paperwork with the pt via [facility] [place], and to call report to [phone].  Doylene Canninghomas Emmie Frakes, MA Triage Specialist 09/15/2014 @ 16:07

## 2014-09-15 NOTE — ED Notes (Signed)
[facility] and doesn't think that he is ready for discharge so he came here today to see if he can go back over there. Pt states that he has a plan of taking his medications to overdose.   Pt also stated that his nose bled last night bc he woke up with dried blood on his face and pillow.  Pt also stating that his hands are shaking really bad, "as today I was trying to take a sefie and my hands were shaking so bad'.

## 2014-09-15 NOTE — BH Assessment (Signed)
Writer informed of consult.

## 2014-09-15 NOTE — ED Notes (Signed)
Telepsych in progress. 

## 2014-09-15 NOTE — Progress Notes (Addendum)
Pt. Admitted voluntarily. [facility] 09/[facility].   Pt. Presented to ED stating that he was still feeling suicidal and couldn't trust himself to be safe. Pt. Also states that he has money in the bank and could not trust himself to stay sober.  Pt. Brought in a bag full of medicine samples that were given to him from pharmacy at discharge yesterday. 09/14/14  (this bag is currently in locker 24)  Pt. Is depressed and anxious.  He is quiet and guarded.  He contracts for safety.

## 2014-09-16 DIAGNOSIS — F1220 Cannabis dependence, uncomplicated: Secondary | ICD-10-CM

## 2014-09-16 DIAGNOSIS — F3130 Bipolar disorder, current episode depressed, mild or moderate severity, unspecified: Secondary | ICD-10-CM

## 2014-09-16 DIAGNOSIS — F1020 Alcohol dependence, uncomplicated: Secondary | ICD-10-CM

## 2014-09-16 DIAGNOSIS — R45851 Suicidal ideations: Secondary | ICD-10-CM

## 2014-09-16 MED ORDER — OLANZAPINE 5 MG PO TBDP
5.0000 mg | ORAL_TABLET | Freq: Every day | ORAL | Status: DC
  Administered 2014-09-17 – 2014-09-20 (×4): 5 mg via ORAL
  Filled 2014-09-16 (×8): qty 1
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
## 2014-09-16 NOTE — Progress Notes (Addendum)
PATIENT STATED HE HAS ALREADY HAD THE FLU VACCINE THIS YEAR AND DOES NOT WANT THE PNEUMONIA VACCINE.

## 2014-09-16 NOTE — Clinical Social Work Note (Signed)
CSW spoke with, patient's supervisor, Synetta FailAnita at [employer] [address], to advise patient in the hospital and will need additional time out of work.  CSW asked if patient was eligible for FMLA.  She deferred that question to the owner of the restaurant, Catha BrowJim Smith at [phone].  Mr. Katrinka BlazingSmith advised patient's job is not in jeopardy and he would honor request for patient to have additional time off from work.  He asked how much time we were speaking of.  CSW advised an estimated two to four additional weeks, possibly, but nothing has been confirmed by the MD at this time.  CSW to call Mr. Katrinka BlazingSmith back to let him know for certain how much time would be needed, if any.  [facility] as requested by patient.  [facility] [phone].
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
## 2014-09-16 NOTE — [facility] Group Notes (Signed)
The focus of this group is to educate the patient on the purpose and policies of crisis stabilization and provide a format to answer questions about their admission.  The group details unit policies and expectations of patients while admitted.  Patient attended 0900 nurse education orientation group this morning.  Patient actively participated, appropriate affect, alert, appropriate insight and engagement.  Today patient will work on 3 goals for discharge.  
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
## 2014-09-16 NOTE — [facility] Group Notes (Signed)
[facility] LCSW Group Therapy      Feelings About Diagnosis 1:15 - 2:30 PM         09/16/2014  2:42 PM    Type of Therapy:  Group Therapy  Participation Level:  Active  Participation Quality:  Appropriate  Affect:  Appropriate  Cognitive:  Alert and Appropriate  Insight:  Developing/Improving and Engaged  Engagement in Therapy:  Developing/Improving and Engaged  Modes of Intervention:  Discussion, Education, Exploration, Problem-Solving, Rapport Building, Support  Summary of Progress/Problems:  Patient actively participated in group. Patient discussed past and present diagnosis and the effects it has had on  life.  Patient shared he feelings different because of his diagnosis of Bipolar Disorder.  He stated he feels people are looking at him and talking about him.  Patient talked about family and society being judgmental and the stigma associated with having a mental health diagnosis.  [name], Yena Tisby Hairston 09/16/2014  2:42 PM

## 2014-09-16 NOTE — Progress Notes (Signed)
D:  Patient's self inventory sheet, patient sleeps good, no sleep medication given.  Fair appetite, normal energy level, good concentration.  Rated depression, hopeless and anxiety 10+.  Has experienced cramping, anxiety.  Denied SI.  Physical problems of back and right arm pain for past week.  Goal is to be sober and work on high anxiety.  Goal is to tell MD my anxiety is very high.  Feels very depressed.  Does not feel that anxiety shows on his face but anxiety is very high.  No discharge plans.  "I feel like shit." A:  Medications administered per MD orders.  Emotional support and encouragement given patient. R:  Denied SI and HI, contracts for safety.  Denied A/V hallucinations.  Safety maintained with 15 minute checks.

## 2014-09-16 NOTE — Progress Notes (Signed)
[facility] Group Notes:  (Nursing/MHT/Case Management/Adjunct)  Date:  09/16/2014  Time:  12:24 AM  Type of Therapy:  Psychoeducational Skills  Participation Level:  Minimal  Participation Quality:  Inattentive  Affect:  Depressed  Cognitive:  Appropriate  Insight:  Improving  Engagement in Group:  Resistant  Modes of Intervention:  Education  Summary of Progress/Problems: The patient did not share anything with regards to his day, but instead focused on the fact that he returned to the hospital since he was not ready for discharge. In terms of the theme for the day, his steps to wellness will include "learning how to say no" to people. He admits to being around people who are a bad influence on him and that he realizes that he needs find new friends are that are positive and more supportive of him.   Marcellene Shivley S 09/16/2014, 12:24 AM

## 2014-09-16 NOTE — Tx Team (Addendum)
Interdisciplinary Treatment Plan Update   Date Reviewed:  09/16/2014  Time Reviewed:  8:51 AM  Progress in Treatment:   Attending groups: Patient is new to the mileu. Participating in groups: Patient is new to the mileu. Taking medication as prescribed: Yes  Tolerating medication: Yes Family/Significant other contact made:  No, but will ask patient for consent for collateral contact Patient understands diagnosis: Yes  Discussing patient identified problems/goals with staff: Yes Medical problems stabilized or resolved: Yes Denies suicidal/homicidal ideation: Yes Patient has not harmed self or others: Yes  For review of initial/current patient goals, please see plan of care.  Estimated Length of Stay:  2-4 days  Reasons for Continued Hospitalization:  Anxiety Depression Medication stabilization Suicidal ideation  New Problems/Goals identified:    Discharge Plan or Barriers:   [facility].  Additional Comments:   [name] is an 19 y.o. white male that reporst SI with a plan to overdose on medication. [facility] and doesn't think that he is ready for discharge so he came here today to see if he can go back over there. [facility] due to an overdose on Serquel. Patient reports depression and feelings that someone is talking about him all of the time. Patient reports due to being discharged yesterday he was not able to follow up with outpatient discharge plan for therapy and medication management. During the assessment patient was calm and cooperative. Patient reports several psychiatric hospitalizations for SI. Patient was not able to remember the names of the facilities or the year in which he was hospitalized. Patient reports increased feelings of anxiety. Patient denies any recent stressors. Patient reports that his hands are shaking really bad, "as today I was trying to take a sefie and my hands  were shaking so bad'. Patient reports using alcohol and marijuana to manage his depression and anxiety. Patient reports using marijuana and alcohol daily. Patient denies any prior substance abuse treatment or detox. Patient reports very few withdrawal symptoms. Patient denies seizures associated with substance abuse. Patient reports that he has been using marijuana since the age of 19 and drinking alcohol since the age of 19. Patient reports that his last use of marijuana and alcohol was on August 31, 2014. However, his UDS is positive for marijuana and his BAL is <5. Patient reports that he has used drugs and alcohol in the past to manage his anxiety and depression.    Patient and CSW reviewed patient's identified goals and treatment plan.  Patient verbalized understanding and agreed to treatment plan.   Attendees:  Patient:  09/16/2014 8:51 AM   Signature:  Sallyanne HaversF. Cobos, MD 09/16/2014 8:51 AM  Signature: Geoffery LyonsIrving Lugo, MD 09/16/2014 8:51 AM  Signature:   09/16/2014 8:51 AM  Signature:Beverly Terrilee CroakKnight, RN 09/16/2014 8:51 AM  Signature:  Robbie LouisVivian Kent, RN 09/16/2014 8:51 AM  Signature:  Juline PatchQuylle Aloria Looper, LCSW 09/16/2014 8:51 AM  Signature:  Belenda CruiseKristin Drinkard, LCSW-A 09/16/2014 8:51 AM  Signature:  Leisa LenzValerie Enoch, Care Coordinator [facility] 09/16/2014 8:51 AM  Signature:   09/16/2014 8:51 AM  Signature: 09/16/2014  8:51 AM  Signature:    09/16/2014  8:51 AM  Signature:  09/16/2014  8:51 AM    Scribe for Treatment Team:   Juline PatchQuylle Dewitte Vannice,  09/16/2014 8:51 AM

## 2014-09-16 NOTE — Progress Notes (Signed)
Recreation Therapy Notes  Animal-Assisted Activity/Therapy (AAA/T) Program Checklist/Progress Notes Patient Eligibility Criteria Checklist & Daily Group note for Rec Tx Intervention  Date: 01.26.2016 Time: 2:45pm [address]   AAA/T Program Assumption of Risk Form signed by Patient/ or Parent Legal Guardian yes  Patient is free of allergies or sever asthma yes  Patient reports no fear of animals yes  Patient reports no history of cruelty to animals yes  Patient understands his/her participation is voluntary yes  Patient washes hands before animal contact yes  Patient washes hands after animal contact yes  Behavioral Response: Appropriate, Engaged.   Education: Charity fundraiserHand Washing, Health visitorAppropriate Animal Interaction   Education Outcome: Acknowledges education.   Clinical Observations/Feedback: Patient actively engaged in session, petting therapy dog appropriately.   Marykay Lexenise L Kee Drudge, LRT/CTRS  Jearl KlinefelterBlanchfield, Brandilee Pies L 09/16/2014 4:42 PM

## 2014-09-16 NOTE — Progress Notes (Signed)
Pt attended spiritual care group on grief and loss facilitated by chaplain [name] and counseling intern [name] . Group opened with brief discussion and psycho-social ed around grief and loss in relationships and in relation to self - identifying life patterns, circumstances, changes that cause losses. Established group norm of speaking from own life experience. Group goal of establishing open and affirming space for members to share loss and experience with grief, normalize grief experience and provide psycho social education and grief support.  Group drew on narrative and Alderian therapeutic modalities.   [name] was present for parts of groups but did not contribute to the discussion.   [name] Counseling Intern

## 2014-09-16 NOTE — Progress Notes (Signed)
Patient ID: Tristan DameMatthew J Rodriguez, male   DOB: [birth year], 19 y.o.   MRN: [id number]   D: Pt informed the writer that he came back to [facility] because he "couldn't be sober in the house he was living in".  Stated that when he got there he was "shaking and didn't feel comfortable". Pt doesn't believe that his "friends" will let him come back to the apt. Pt stated he wants to go in the [employer], except "he doesn't like people yelling at him".   A:  Support and encouragement was offered. 15 min checks continued for safety.  R: Pt remains safe.

## 2014-09-16 NOTE — Plan of Care (Signed)
Problem: Consults Goal: Depression Patient Education See Patient Education Module for education specifics.  Outcome: Completed/Met Date Met:  09/16/14 Nurse discussed depression feelings with patient.

## 2014-09-17 DIAGNOSIS — F1010 Alcohol abuse, uncomplicated: Secondary | ICD-10-CM

## 2014-09-17 DIAGNOSIS — T43501A Poisoning by unspecified antipsychotics and neuroleptics, accidental (unintentional), initial encounter: Secondary | ICD-10-CM

## 2014-09-17 DIAGNOSIS — F329 Major depressive disorder, single episode, unspecified: Principal | ICD-10-CM

## 2014-09-17 MED ORDER — GABAPENTIN 100 MG PO CAPS
200.0000 mg | ORAL_CAPSULE | Freq: Three times a day (TID) | ORAL | Status: DC
  Administered 2014-09-17 – 2014-09-19 (×7): 200 mg via ORAL
  Filled 2014-09-17 (×12): qty 2

## 2014-09-17 MED ORDER — TRAZODONE HCL 50 MG PO TABS: 50.0000 mg | ORAL_TABLET | Freq: Every day | ORAL | Status: DC

## 2014-09-17 NOTE — Progress Notes (Signed)
Patient ID: Tristan DameMatthew J Rodriguez, male   DOB: July 19, 1996, 19 y.o.   MRN: [id number]   D: Pt informed the writer that he "started his day depressed" and it went into the evening. Pt informed the writer that he was feeling "good" at the time of assessment.   A:  Support and encouragement was offered. 15 min checks continued for safety.  R: Pt remains safe.

## 2014-09-17 NOTE — Clinical Social Work Note (Signed)
[facility] at [facility].

## 2014-09-17 NOTE — Progress Notes (Signed)
D: Pt denies SI/HI/AVH. Pt is pleasant and cooperative. Pt stated he was in a bad mood and had increased anxiety all day. Pt appears very dramatic with his symptoms and his complaints. Pt appears to over inflate his problems or over dramatize them.   A: Pt was offered support and encouragement. Pt was given scheduled medications. Pt was encourage to attend groups. Q 15 minute checks were done for safety.   R:Pt attends groups and interacts well with peers and staff. Pt is taking medication.Pt receptive to treatment and safety maintained on unit.

## 2014-09-17 NOTE — Progress Notes (Signed)
Patient Discharge Instructions:  Next Level Care Provider Has Access to the EMR, 09/17/14  [facility], the entire medical records is made available via [facility]/Epic access.  [name], 09/17/2014, 3:56 PM

## 2014-09-17 NOTE — Plan of Care (Signed)
Problem: Ineffective individual coping Goal: LTG: Patient will report a decrease in negative feelings Outcome: Not Progressing Pt stated he was agitated and in a bad mood all day  Problem: Alteration in mood & ability to function due to Goal: LTG-Pt reports reduction in suicidal thoughts (Patient reports reduction in suicidal thoughts and is able to verbalize a safety plan for whenever patient is feeling suicidal)  Outcome: Progressing Pt stated he denied SI at this time

## 2014-09-17 NOTE — Progress Notes (Signed)
Adult Psychoeducational Group Note  Date:  09/17/2014 Time:  10:08 PM  Group Topic/Focus:  Narcotics Anonymous  Participation Level:  Active  Additional Comments:  Pt attended and participated in group.  Berlin Hunuttle, Josy Peaden M 09/17/2014, 10:08 PM

## 2014-09-17 NOTE — [facility] Group Notes (Signed)
[facility] LCSW Group Therapy  Emotional Regulation 1:15 - 2: 30 PM        09/17/2014     Type of Therapy:  Group Therapy  Participation Level:  Appropriate  Participation Quality:  Appropriate  Affect:  Depressed, Flat  Cognitive:   Appropriate  Insight:  Developing/Improving Engaged  Engagement in Therapy:  Developing/Improving Engaged  Modes of Intervention:  Discussion Exploration Problem-Solving Supportive  Summary of Progress/Problems:  Group topic was emotional regulations.  Patient participated in the discussion and was able to identify an emotion that needed to regulated. He shared fear and nervousness are problems for him.  Patient encouraged to use deep breathing and relaxation skills to calm down.  CSW lead patients through an exercise to help with calming anxiety.  [name], [name] 09/17/2014
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
## 2014-09-17 NOTE — Progress Notes (Signed)
Patient ID: [name], male   DOB: 1995-09-03, 19 y.o.   MRN: [id number]  DAR: Pt. Denies SI/HI and A/V Hallucinations to this Clinical research associatewriter. Patient however does report marked anxiety and depression. Patient reports his sleep last night was poor, appetite is fair, energy level is low, and concentration is "kinda good." Patient reports his goal for the day is to the Child psychotherapistsocial worker. Patient rates his anxiety at 8/10 for the day and has received PRN Vistaril for this which has been decreased. Patient rates his depression and hopelessness at 7/10 for the day. Patient does not report any pain or discomfort at this time. Support and encouragement provided to the patient. Scheduled medication administered to patient per physician's orders. Patient is receptive and cooperative with Clinical research associatewriter but minimal. Patient is seen in the milieu interacting with peers and is attending groups. Q15 minute checks are maintained for safety.

## 2014-09-17 NOTE — [facility] Suicide Risk Assessment (Signed)
[facility] INPATIENT:  Family/Significant Other Suicide Prevention Education  Suicide Prevention Education:  Education Completed; [name], Boyfriend, [phone]; has been identified by the patient as the family member/significant other with whom the patient will be residing, and identified as the person(s) who will aid the patient in the event of a mental health crisis (suicidal ideations/suicide attempt).  With written consent from the patient, the family member/significant other has been provided the following suicide prevention education, prior to the and/or following the discharge of the patient.  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  The suicide prevention education provided includes the following:  Suicide risk factors  Suicide prevention and interventions  National Suicide Hotline telephone number  [facility] assessment telephone number  [facility] Assistance 911  [facility] and/or Residential Mobile Crisis Unit telephone number  Request made of family/significant other to:  Remove weapons (e.g., guns, rifles, knives), all items previously/currently identified as safety concern.   Boyfriend advised patient does not have access to weapons.     Remove drugs/medications (over-the-counter, prescriptions, illicit drugs), all items previously/currently identified as a safety concern.  The family member/significant other verbalizes understanding of the suicide prevention education information provided.  The family member/significant other agrees to remove the items of safety concern listed above.  [name], [name] 09/17/2014, 12:57 PM

## 2014-09-17 NOTE — [facility] Group Notes (Signed)
[facility]   09/17/2014 11:12 AM    Participation Quality:  Appropraite  Mood/Affect:  Appropriate  Depression Rating:  8  Anxiety Rating:  8  Thoughts of Suicide:  No  Will you contract for safety?   NA  Current AVH:  No  Plan for Discharge/Comments:  Patient attended discharge planning group and actively participated in group.  He advised of interest in residential treatment.  [facility]  Suicide prevention education reviewed and SPE document provided.   Transportation Means: Patient has transportation.   Supports:  Patient has a support system.   Marla Pouliot, [name]

## 2014-09-18 LAB — CBC WITH DIFFERENTIAL/PLATELET
Basophils Absolute: 0 10*3/uL (ref 0.0–0.1)
Basophils Relative: 0 % (ref 0–1)
Eosinophils Absolute: 0.2 10*3/uL (ref 0.0–0.7)
Eosinophils Relative: 4 % (ref 0–5)
HCT: 40.3 % (ref 39.0–52.0)
Hemoglobin: 13.3 g/dL (ref 13.0–17.0)
LYMPHS PCT: 35 % (ref 12–46)
Lymphs Abs: 1.8 10*3/uL (ref 0.7–4.0)
MCH: 30.4 pg (ref 26.0–34.0)
MCHC: 33 g/dL (ref 30.0–36.0)
MCV: 92 fL (ref 78.0–100.0)
MONO ABS: 0.8 10*3/uL (ref 0.1–1.0)
Monocytes Relative: 16 % — ABNORMAL HIGH (ref 3–12)
Neutro Abs: 2.3 10*3/uL (ref 1.7–7.7)
Neutrophils Relative %: 45 % (ref 43–77)
Platelets: 283 10*3/uL (ref 150–400)
RBC: 4.38 MIL/uL (ref 4.22–5.81)
RDW: 13.7 % (ref 11.5–15.5)
WBC: 5 10*3/uL (ref 4.0–10.5)
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
## 2014-09-18 NOTE — [facility] Group Notes (Signed)
[facility] 0900 Nursing Group  The focus of this group is to educate the patient on the purpose and policies of crisis stabilization and provide a format to answer questions about their admission.  The group details unit policies and expectations of patients while admitted.  Patient attended group and was engaged. Patient received a journal to write down thoughts and questions for the MD. Patient had no questions or concerns.

## 2014-09-18 NOTE — [facility] Group Notes (Signed)
[facility] LCSW Group Therapy  [facility] 1:15 - 2:30 PM  09/18/2014 3:07 PM   Type of Therapy:  Group Therapy  Participation Level: Minimal  Participation Quality:  Attentive  Affect:  Appropriate  Cognitive:  Appropriate  Insight:  Developing/Improving   Engagement in Therapy:  Developing/Improving   Modes of Intervention:  Discussion, Education, Exploration, Problem-Solving, Rapport Building, Support   Summary of Progress/Problems:   Patient was attentive to speaker from the [employer] [facility] [employer] as he shared his story of dealing with mental health/substance abuse issues and overcoming it by working a recovery program.  Patient expressed interest in their programs and services and received information on their agency.    [name], Syleena Mchan Hairston 09/18/2014 3:07 PM

## 2014-09-18 NOTE — Progress Notes (Signed)
Patient ID: [name], [name]   DOB: [birth year], 19 y.o.   MRN: [id number]  DAR: Pt. Denies SI/HI and A/V Hallucinations. Patient reports increased anxiety and received PRN Vistaril and reported relief on reassessment. Patient rates his anxiety at 7/10, hopelessness 7/10, and depression 5/10 for the day. Patient reports sleeping good last night, appetite is good, energy level is normal and concentration level is good. Patient does report pain in lower back and received PRN medication and reported minimal relief. Patient does appear in distress when writer has observed patient watching television in the dayroom. Support and encouragement provided to the patient to come to writer with questions or concerns. Scheduled medications administered to patient per physician's orders. Patient is receptive and cooperative with Clinical research associatewriter but minimal. Q15 minute checks are maintained for safety.

## 2014-09-18 NOTE — Plan of Care (Signed)
Problem: Ineffective individual coping Goal: STG: Patient will remain free from self harm Outcome: Completed/Met Date Met:  09/18/14 Patient has remained free from self harm as evidenced by Q15 minute safety checks,
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
## 2014-09-19 LAB — HEPATIC FUNCTION PANEL
ALT: 13 U/L (ref 0–53)
AST: 19 U/L (ref 0–37)
Albumin: 4.1 g/dL (ref 3.5–5.2)
Alkaline Phosphatase: 82 U/L (ref 39–117)
Bilirubin, Direct: <0.1 mg/dL (ref 0.0–0.5)
TOTAL PROTEIN: 7.1 g/dL (ref 6.0–8.3)
Total Bilirubin: 0.4 mg/dL (ref 0.3–1.2)

## 2014-09-19 LAB — RPR: RPR Ser Ql: NONREACTIVE

## 2014-09-19 LAB — VALPROIC ACID LEVEL: VALPROIC ACID LVL: 65.3 ug/mL (ref 50.0–100.0)

## 2014-09-19 LAB — HIV ANTIBODY (ROUTINE TESTING W REFLEX): HIV SCREEN 4TH GENERATION: NONREACTIVE

## 2014-09-19 MED ORDER — GABAPENTIN 300 MG PO CAPS
300.0000 mg | ORAL_CAPSULE | Freq: Three times a day (TID) | ORAL | Status: DC
  Administered 2014-09-19 – 2014-09-23 (×12): 300 mg via ORAL
  Filled 2014-09-19 (×8): qty 1
  Filled 2014-09-19: qty 42
  Filled 2014-09-19: qty 1
  Filled 2014-09-19: qty 42
  Filled 2014-09-19 (×3): qty 1
  Filled 2014-09-19: qty 42

## 2014-09-19 NOTE — Plan of Care (Signed)
Problem: Ineffective individual coping Goal: LTG: Patient will report a decrease in negative feelings Outcome: Not Progressing Pt continues to make negative statements Goal: STG: Pt will be able to identify effective and ineffective STG: Pt will be able to identify effective and ineffective coping patterns  Outcome: Not Progressing Pt has minimal insight into effective coping skills

## 2014-09-19 NOTE — Plan of Care (Signed)
Problem: Alteration in mood Goal: LTG-Patient reports reduction in suicidal thoughts (Patient reports reduction in suicidal thoughts and is able to verbalize a safety plan for whenever patient is feeling suicidal)  Outcome: Progressing Pt denies si thoughts Goal: STG-Patient reports thoughts of self-harm to staff Outcome: Progressing Pt denies si

## 2014-09-19 NOTE — Tx Team (Signed)
Interdisciplinary Treatment Plan Update   Date Reviewed:  09/19/2014  Time Reviewed:  8:46 AM  Progress in Treatment:   Attending groups: Patient is attending groups. Participating in groups: Patient engages in discussions. Taking medication as prescribed: Yes  Tolerating medication: Yes Family/Significant other contact made:  Yes, collateral contact with boyfriend Patient understands diagnosis:  Yes, patient understands diagnosis and need for treatment. Discussing patient identified problems/goals with staff: Yes, patient is able to express goals for treatment and discharge. Medical problems stabilized or resolved: Yes Denies suicidal/homicidal ideation: Yes Patient has not harmed self or others: Yes  For review of initial/current patient goals, please see plan of care.  Estimated Length of Stay:  3-4 days  Reasons for Continued Hospitalization:  Anxiety Depression Medication stabilization   New Problems/Goals identified:    Discharge Plan or Barriers:   [facility].  Patient [facility] on Tuesday, September 23, 2014.  Additional Comments:   Continue medication stabilization.  Patient and CSW reviewed patient's identified goals and treatment plan.  Patient verbalized understanding and agreed to treatment plan.   Attendees:  Patient:  09/19/2014 8:46 AM   Signature:  Sallyanne HaversF. Cobos, MD 09/19/2014 8:46 AM  Signature: Geoffery LyonsIrving Lugo, MD 09/19/2014 8:46 AM  Signature:  Waynetta SandyJan Wright, RN 09/19/2014 8:46 AM  Signature: Rodman KeyJanet Webb, RN 09/19/2014 8:46 AM  Signature:  Robbie LouisVivian Kent, RN 09/19/2014 8:46 AM  Signature:  Juline PatchQuylle Cordera Stineman, LCSW 09/19/2014 8:46 AM  Signature:  Belenda CruiseKristin Drinkard, LCSW-A 09/19/2014 8:46 AM  Signature:  Leisa LenzValerie Enoch, Care Coordinator [facility] 09/19/2014 8:46 AM  Signature:   09/19/2014 8:46 AM  Signature: 09/19/2014  8:46 AM  Signature:    09/19/2014  8:46 AM  Signature:  09/19/2014  8:46 AM     Scribe for Treatment Team:   Juline PatchQuylle Sanaii Caporaso,  09/19/2014 8:46 AM
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
## 2014-09-19 NOTE — Progress Notes (Signed)
D:Pt rates his depression and anxiety as a 7 on 1-10 scale with 10 being the most. He c/o low back pain and anxiety this morning and was given prn medication. Pt c/o agitation and does not want to discuss why he feels agitated. A:Offered support, encouragement and 15 minute checks. R:Pt denies si and hi. Safety maintained on the unit.

## 2014-09-19 NOTE — [facility] Group Notes (Signed)
[facility] LCSW Group Therapy  Feelings Around Relapse 1:15 -2:30        09/19/2014 2:53 PM   Type of Therapy:  Group Therapy  Participation Level:  Appropriate  Participation Quality:  Appropriate  Affect:  Flat, Depressed  Cognitive:  Attentive Appropriate  Insight:  Developing/Improving  Engagement in Therapy: Developing/Improving  Modes of Intervention:  Discussion Exploration Problem-Solving Supportive  Summary of Progress/Problems:  The topic for today was feelings around relapse. Patient processed feelings toward relapse and was able to relate to peers. He shared if he were not here he would be smoking THC and drinking.  He advised of being very hurt and disappointed by friends with whom he has been living not being more supportive. 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  [name], Jayleon Mcfarlane Hairston 09/19/2014 2:53 PM

## 2014-09-19 NOTE — Progress Notes (Signed)
D   Pt reports being pissed off at his boyfriend for not calling him and he just wanted to take all of his medications and go to bed   Pt said otherwise he had a good day   He attended group and participated  He interacts appropritaely with others A   Verbal support given   Medications administered and effectiveness monitored   Q 15 min checks R    Pt safe at present

## 2014-09-19 NOTE — [facility] Group Notes (Signed)
[facility]   09/19/2014 11:13 AM    Participation Quality:  Appropraite  Mood/Affect:  Depressed, Flat  Depression Rating:  8  Anxiety Rating:  8  Thoughts of Suicide:  No  Will you contract for safety?   NA  Current AVH:  No  Plan for Discharge/Comments:  Patient attended discharge planning group and actively participated in group. Patient advised of being [facility]. Suicide prevention education reviewed and SPE document provided.   Transportation Means: Patient has transportation.   Supports:  Patient has a limited support system.   Isiac Breighner, Joesph JulyQuylle Hairston

## 2014-09-20 MED ORDER — QUETIAPINE FUMARATE 25 MG PO TABS
ORAL_TABLET | ORAL | Status: AC
  Filled 2014-09-20: qty 2

## 2014-09-20 MED ORDER — QUETIAPINE FUMARATE 50 MG PO TABS
50.0000 mg | ORAL_TABLET | Freq: Four times a day (QID) | ORAL | Status: DC | PRN
  Administered 2014-09-20 – 2014-09-22 (×3): 50 mg via ORAL
  Filled 2014-09-20: qty 1

## 2014-09-20 NOTE — Progress Notes (Signed)
[facility] Group Notes:  (Nursing/MHT/Case Management/Adjunct)  Date:  09/20/2014  Time:  2030  Type of Therapy:  wrap up group  Participation Level:  Active  Participation Quality:  Appropriate, Attentive, Sharing and Supportive  Affect:  Appropriate  Cognitive:  Appropriate  Insight:  Improving  Engagement in Group:  Engaged  Modes of Intervention:  Clarification, Education and Support  Summary of Progress/Problems: Pt is going to start putting himself first, God and then himself. Pt shared that he had opened his heart to too many people that took advantage of his love, kindness, and loyalty.  Shelah LewandowskySquires, Hodan Wurtz Carol 09/20/2014, 10:15 PM

## 2014-09-20 NOTE — [facility] Group Notes (Signed)
[facility] Group Notes:  (Clinical Social Work)  09/20/2014     10:30-11AM  Summary of Progress/Problems:   The main focus of today's process group was to discuss what the patients would like to work on changing in their lives, and how they might go about it.    The patient stated he wants to change his anxiety and wants to work on his sobriety.  One thing he thinks he can do is exercise, stating there is a small gym near his apartment.  He said that has worked in the past for him, and helped him to feel better.  He was very engaged and interested in what others had to say.  Type of Therapy:  Group Therapy - Process   Participation Level:  Active  Participation Quality:  Attentive  Affect:  Blunted  Cognitive:  Appropriate  Insight:  Developing/Improving  Engagement in Therapy:  Developing/Improving  Modes of Intervention:  Education, Motivational Interviewing  Ambrose MantleMareida Grossman-Orr, LCSW 09/20/2014, 2:41 PM

## 2014-09-20 NOTE — Progress Notes (Signed)
Patient up this morning and visible in the milieu. Quiet. Observed working on puzzle. Is somewhat cautious in interaction however cooperative. He rates his depression as a 5/10, hopelessness at a 6/10 and anxiety at a 6/10. His affect is flat, anxious with congruent mood. Complaining of a backache this morning rated at an 8/10. Pt offered support and encouragement. Medicated per orders and tylenol given for pain. On reassess, patient rates his pain at a 6/10. He denies SI/HI at this time and remains safe. Lawrence MarseillesFriedman, Nayla Dias Eakes
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
## 2014-09-20 NOTE — Progress Notes (Signed)
Patient called this Clinical research associatewriter to his room. Face flushed, fists clenched. "I just feel like I want to fuck some shit up. I want to just tear everything up. Go off and I'm trying really hard not to." Patient states his general sadness most likely has triggered this rage. MD notified and orders received for seroquel 50mg  po prn q 6 hours. Given without difficulty and discussed coping skills and plans going forward. Asked that patient consider establishing relationship with an OP therapist and patient receptive. Patient contracts for safety and is currently in the shower to further relax. Lawrence MarseillesFriedman, Leanette Eutsler Eakes

## 2014-09-20 NOTE — [facility] Group Notes (Signed)
[facility] Group Notes:  (Nursing/MHT/Case Management/Adjunct)  Date:  09/20/2014  Time:  9:30am  Type of Therapy:  Nurse Education - Goal Setting/Coping Skills  Participation Level:  Active  Participation Quality:  Attentive  Affect:  Anxious  Cognitive:  Alert  Insight:  Improving  Engagement in Group:  Limited  Modes of Intervention:  Discussion, Education and Support  Summary of Progress/Problems: Discussed how to go about goal setting via the SMART method. Also discussed the theme for today, coping skills and how important they are in conjunction with medication management. Patient was attentive but did not contribute to the discussion.  Lawrence MarseillesFriedman, Corbyn Wildey Eakes 09/20/2014, 5:52 PM

## 2014-09-20 NOTE — Progress Notes (Signed)
Pt alert and cooperative. Affect/mood depressed and labile. "I feel better than earlier". -SI/HI, verbally contracts for safety. -A/[place]. Visible in dayroom, interactive with peers. Emotional support and encouragement given. Will continue to monitor closely and evaluate for stabilization.

## 2014-09-20 NOTE — Plan of Care (Signed)
Problem: Diagnosis: Increased Risk For Suicide Attempt Goal: STG-Patient Will Attend All Groups On The Unit Outcome: Progressing Patient attending groups, listens and is engaged.  Problem: Alteration in mood & ability to function due to Goal: STG-Patient will comply with prescribed medication regimen (Patient will comply with prescribed medication regimen)  Outcome: Progressing Patient has been med compliant.
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
## 2014-09-21 MED ORDER — QUETIAPINE FUMARATE 50 MG PO TABS
50.0000 mg | ORAL_TABLET | Freq: Two times a day (BID) | ORAL | Status: DC
  Administered 2014-09-21 – 2014-09-22 (×2): 50 mg via ORAL
  Filled 2014-09-21 (×4): qty 1
  Filled 2014-09-21 (×2): qty 56

## 2014-09-21 MED ORDER — QUETIAPINE FUMARATE 100 MG PO TABS
100.0000 mg | ORAL_TABLET | Freq: Every day | ORAL | Status: DC
  Administered 2014-09-21: 100 mg via ORAL
  Filled 2014-09-21 (×3): qty 1

## 2014-09-21 NOTE — Progress Notes (Signed)
Pt alert, irritable and agitated at times but cooperative. Affect/mood depressed and labile. -SI/HI, verbally contracts for safety. -A/[place]. Visible in dayroom, interactive with peers, left group early stating he was feeling anxious and "I want to fuck somebody up", see MAR.Marland Kitchen. Emotional support, redirection and encouragement given. Will continue to monitor closely and evaluate for stabilization.

## 2014-09-21 NOTE — [facility] Group Notes (Signed)
[facility] Group Notes:  (Nursing/MHT/Case Management/Adjunct)  Date:  09/21/2014  Time:  0930  Type of Therapy:  Psychoeducational Skills  Participation Level:  Minimal  Participation Quality:  Attentive  Affect:  Blunted and Flat  Cognitive:  Alert  Insight:  Lacking  Engagement in Group:  Lacking  Modes of Intervention:  Support  Summary of Progress/Problems: [name] attended with little participation.  He reports continued depressive symptoms.  [name], [name] 09/21/2014, 9:55 AM

## 2014-09-21 NOTE — [facility] Group Notes (Signed)
[facility] Group Notes:  (Clinical Social Work)  09/21/2014  10:00-11:00AM  Summary of Progress/Problems:   The main focus of today's process group was to   1)  discuss the importance of adding supports  2)  define health supports versus unhealthy supports  3)  identify the patient's current unhealthy supports and plan how to handle them  4)  Identify the patient's current healthy supports and plan what to add.  An emphasis was placed on using counselor, doctor, therapy groups, 12-step groups, and problem-specific support groups to expand supports.    The patient expressed full comprehension of the concepts presented, and agreed that there is a need to add more supports.  The patient stated his twin sister is his only healthy support, who loves and accepts him unconditionally.  He stated that all his friends do the things that he wants to stop doing, so he is afraid he will be lonely if he eliminates them from his life.  Type of Therapy:  Process Group with Motivational Interviewing  Participation Level:  Minimal  Participation Quality:  Attentive and Inattentive  Affect:  Blunted  Cognitive:  Appropriate  Insight:  Distracting  Engagement in Therapy:  Engaged  Modes of Intervention:   Education, Support and Processing, Activity  Pilgrim's PrideMareida Grossman-Orr, LCSW 09/21/2014, 12:15pm

## 2014-09-21 NOTE — Progress Notes (Signed)
Adult Psychoeducational Group Note  Date:  09/21/2014 Time:  3:18 PM  Group Topic/Focus:  Therapeutic Activity  Participation Level:    Did Not Attend  Elijio MilesMercer, Elky Funches N 09/21/2014, 3:18 PM

## 2014-09-21 NOTE — Progress Notes (Signed)
D: Patient presents with sad, depressed mood.  States, "I don't feel good."  Patient denies SI/HI/AVH.  When asked where he is in his tx, he replied, "I don't know."  Patient was guarded with minimal interaction.  He did ask for his vistaril before medication pass.  When asked what was making him anxious, he replied, "I don't know."  He reports good sleep, appetite and energy level.  He rates his depression as an 8; hopelessness as a 7; anxiety as an 8.  Patient reports withdrawal symptoms of craving, cramping and agitation.  He want to work on "being calm" today.  He also wrote on his self inventory, "feeling anger and wanting to act on it, not caring at all."  [facility] A: Continue to monitor medication management and MD orders.  Safety checks completed every 15 minutes per protocol.  Meet 1:1 with patient to discuss needs and offer encouragement. R: Patient's behavior is appropriate to situation.
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
## 2014-09-21 NOTE — Progress Notes (Signed)
Patient did attend the evening speaker AA meeting.  

## 2014-09-22 LAB — HIV ANTIBODY (ROUTINE TESTING W REFLEX): HIV Screen 4th Generation wRfx: NONREACTIVE

## 2014-09-22 MED ORDER — CITALOPRAM HYDROBROMIDE 20 MG PO TABS: 20.0000 mg | ORAL_TABLET | Freq: Every day | ORAL | Status: DC

## 2014-09-22 MED ORDER — LORAZEPAM 1 MG PO TABS
1.0000 mg | ORAL_TABLET | ORAL | Status: AC
  Administered 2014-09-22: 1 mg via ORAL

## 2014-09-22 MED ORDER — LORAZEPAM 1 MG PO TABS
ORAL_TABLET | ORAL | Status: AC
  Filled 2014-09-22: qty 1

## 2014-09-22 MED ORDER — GABAPENTIN 300 MG PO CAPS: 300.0000 mg | ORAL_CAPSULE | Freq: Three times a day (TID) | ORAL | Status: DC

## 2014-09-22 MED ORDER — QUETIAPINE FUMARATE 100 MG PO TABS: 100.0000 mg | ORAL_TABLET | Freq: Every day | ORAL | Status: DC

## 2014-09-22 MED ORDER — QUETIAPINE FUMARATE 25 MG PO TABS
25.0000 mg | ORAL_TABLET | Freq: Four times a day (QID) | ORAL | Status: DC | PRN
  Administered 2014-09-23: 25 mg via ORAL
  Filled 2014-09-22: qty 1
  Filled 2014-09-22: qty 10

## 2014-09-22 MED ORDER — QUETIAPINE FUMARATE 200 MG PO TABS
200.0000 mg | ORAL_TABLET | Freq: Every day | ORAL | Status: DC
  Administered 2014-09-22: 200 mg via ORAL
  Filled 2014-09-22 (×3): qty 1

## 2014-09-22 MED ORDER — QUETIAPINE FUMARATE 50 MG PO TABS: 50.0000 mg | ORAL_TABLET | Freq: Two times a day (BID) | ORAL | Status: DC

## 2014-09-22 MED ORDER — TRAZODONE HCL 50 MG PO TABS: 50.0000 mg | ORAL_TABLET | Freq: Every day | ORAL | Status: DC

## 2014-09-22 MED ORDER — LORAZEPAM 0.5 MG PO TABS
0.5000 mg | ORAL_TABLET | Freq: Four times a day (QID) | ORAL | Status: DC | PRN
  Administered 2014-09-22 – 2014-09-23 (×2): 0.5 mg via ORAL
  Filled 2014-09-22 (×2): qty 1

## 2014-09-22 MED ORDER — DIVALPROEX SODIUM 500 MG PO DR TAB: 500.0000 mg | DELAYED_RELEASE_TABLET | ORAL | Status: DC

## 2014-09-22 NOTE — [facility] Group Notes (Signed)
[facility] Therapy  Overcoming Obstacles 1:15 - 2:30 PM  09/22/2014 2:44 PM  Type of Therapy:  Group Therapy  Participation Level:  Did Not Attend  [name], Emmaly Leech Hairston 09/22/2014, 2:44 PM

## 2014-09-22 NOTE — [facility] Group Notes (Signed)
[facility] Group Notes:  (Nursing/MHT/Case Management/Adjunct)  Date:  09/22/2014  Time:  9:52 PM  Type of Therapy:  Group Therapy  Participation Level:  Active  Participation Quality:  Appropriate  Affect:  Anxious and Defensive  Cognitive:  Appropriate  Insight:  Improving and Limited  Engagement in Group:  Defensive, Improving and Lacking  Modes of Intervention:  Discussion  Summary of Progress/Problems: pt stated he was ready to go, he's been here "too long". Pt stated he wanted to "maintain his anger when he gets out, pt stated he did so so with his anger today"  Delos Haringhillips, Lendora Keys A 09/22/2014, 9:52 PM
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
## 2014-09-22 NOTE — Progress Notes (Signed)
Patient very upset because other people were discharged before him.  Seroquel given per MD prn medication.  Patient became more upset because another patient was upset because he was not yet discharged.  Patient went to his room, threw his belongings in floor, mattress on floor.  AC, nurses, MHT with patient to calm him down.  Ativan 1 mg given per MD now order to calm patient.  Patient in his room pacing, staff with patient, MD aware.  Safety maintained.

## 2014-09-22 NOTE — [facility] Group Notes (Signed)
[facility] Note   09/22/2014 12:49 PM    Participation Quality:  Appropraite  Mood/Affect:  Depressed, Flat, Guarded  Depression Rating:  5  Anxiety Rating:  5  Thoughts of Suicide:  No  Will you contract for safety?   NA  Current AVH:  No  Plan for Discharge/Comments:  Patient attended discharge planning group and actively participated in group. He reports being better but rates hopelessness at seven and helplessness at five.  He advised [facility]. He plans to return to previous living environment and will follow up with [facility].  Suicide prevention education reviewed and SPE document provided.   Transportation Means: Patient has transportation.   Supports:  Patient has a support system.   Jenne Sellinger Hairston  \

## 2014-09-22 NOTE — Progress Notes (Signed)
Pt alert and cooperative this evening. Affect/mood sad and depressed. No aggressive or agitated behavior observed. -SI/HI, verbally contracts for safety. -A/[place]. Visible in dayroom, minimum interaction with peers.. Emotional support and encouragement given. Will continue to monitor closely and evaluate for stabilization.

## 2014-09-22 NOTE — Progress Notes (Addendum)
D:  Patient denied SI and HI, contracts for safety.  Denied A/V hallucinations.   A:  Medications administered per MD orders.  Emotional support and encouragement given patient. R:  Safety maintained with 15 minute checks. Patient stated he was upset during AA meeting last night.  Stated the meeting made him want to go out and drink again.  Patient's self inventory sheet, patient sleeps good, no sleep medication needed.  Good appetite, low energy level, poor concentration.  Rated depression and hopeless #7, anxiety 5.  Denied withdrawals.  Denied SI.  Physical problems of lightheadedness, pain, headaches.  Physical pain for back, headaches, pain #5.  Pain medication is not helpful.  Goal is to decide if he is going to rehab or home tomorrow.  Plans to make a decision.  Headache.  Not yet sure of discharge plans.  No problems anticipated after discharge.

## 2014-09-22 NOTE — Clinical Social Work Note (Signed)
CSW and MD met with patient who stated, "if I could, I would die right now."  Patient endorses being very angry and irritable and feels he needs to act out in order to be heard.  Patient asked if he could go to the quiet room to settle down.  Patient's RN advised and took patient to quiet room. Patient stated he does not want to go to [facility].  Message left on voicemail for [place] at [facility].

## 2014-09-22 NOTE — Plan of Care (Signed)
Problem: Consults Goal: Suicide Risk Patient Education (See Patient Education module for education specifics)  Outcome: Completed/Met Date Met:  09/22/14 Nurse discussed suicide thoughts with patient.     

## 2014-09-22 NOTE — Progress Notes (Addendum)
MD informed that patient now resting in his bed.  Patient stated he was feeling better, that he was not upset with any staff member.  Nurse apologized to patient that he witnessed another patient's behavior which upset him.  Patient stated he was not ready to discharge today.   Patient thanked nurse for snack brought to him in his room.   1740  Patient went to dining room for dinner.  Patient had been sleeping in his room before 1700 medications.  Patient stated he was feeling better after taking anxiety medication.  Patient calm and cooperative after nap.   1830  Patient stated he was feeling better.  Patient has been talking quietly on phone.  Safety maintained with 15 minute checks.

## 2014-09-23 MED ORDER — QUETIAPINE FUMARATE 25 MG PO TABS: 25.0000 mg | ORAL_TABLET | Freq: Four times a day (QID) | ORAL | Status: DC | PRN

## 2014-09-23 MED ORDER — QUETIAPINE FUMARATE 200 MG PO TABS: 200.0000 mg | ORAL_TABLET | Freq: Every day | ORAL | Status: DC

## 2014-09-23 NOTE — [facility] Group Notes (Signed)
The focus of this group is to educate the patient on the purpose and policies of crisis stabilization and provide a format to answer questions about their admission.  The group details unit policies and expectations of patients while admitted.  Patient attended 0900 nurse education orientation group this morning.  Patient actively participated, appropriate affect, alert, appropriate insight and engagement.  Today patient will work on 3 goals for discharge.  

## 2014-09-23 NOTE — Progress Notes (Signed)
Discharge Note:  Patient discharged to home.  Denied SI and HI.  Denied A/V hallucinations.  Denied pain.  Suicide prevention information given and discussed with patient who stated he understood and had no questions.   Patient stated he received all his belongings, back bag, keys, wallet, money, cell phone, all medications, prescriptions.  Patient stated he [facility].

## 2014-09-23 NOTE — Progress Notes (Signed)
  [facility] Discharge [facility] :  Will you be returning to the same living situation after discharge:  Yes,  Patient is returning to his previous living arrangement. At discharge, do you have transportation home?: Yes,  [facility]. Do you have the ability to pay for your medications: No.   Patient needs assistance with indigent medications   Release of information consent forms completed and in the chart;  Patient's signature needed at discharge.  Patient to Follow up at: Follow-up Information    Follow up with Dr. Apolonio SchneidersMickiewicz - [facility] On 09/30/2014.   Why:  You are scheduled with Dr. Alver FisherMickiewicz on Tuesday, September 30, 2014 at [facility]   Contact information:   [address] [place], [place]   [id number]  [phone]      Patient denies SI/HI:  Patient no longer endorsing SI/HI or other thoughts of self harm.  Safety Planning and Suicide Prevention discussed:  .Reviewed with all patients during discharge planning group  Has patient been referred to the Quitline?:  Patient reports he is not a smoker. [name], [name] 09/23/2014, 10:41 AM
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
## 2014-09-23 NOTE — Tx Team (Signed)
Interdisciplinary Treatment Plan Update   Date Reviewed:  09/23/2014  Time Reviewed:  9:05 AM  Progress in Treatment:   Attending groups: Patient is attending groups. Participating in groups: Patient engages in discussions. Taking medication as prescribed: Yes  Tolerating medication: Yes Family/Significant other contact made:  Yes, collateral contact with boyfriend Patient understands diagnosis:  Yes, patient understands diagnosis and need for treatment. Discussing patient identified problems/goals with staff: Yes, patient is able to express goals for treatment and discharge. Medical problems stabilized or resolved: Yes Denies suicidal/homicidal ideation: Yes Patient has not harmed self or others: Yes  For review of initial/current patient goals, please see plan of care.  Estimated Length of Stay:  3-4 days  Reasons for Continued Hospitalization:  Anxiety Depression Medication stabilization   New Problems/Goals identified:    Discharge Plan or Barriers:   [facility]  Additional Comments:   Continue medication stabilization.  Patient and CSW reviewed patient's identified goals and treatment plan.  Patient verbalized understanding and agreed to treatment plan.   Attendees:  Patient:  09/23/2014 9:05 AM   Signature:  Sallyanne HaversF. Cobos, MD 09/23/2014 9:05 AM  Signature: Geoffery LyonsIrving Lugo, MD 09/23/2014 9:05 AM  Signature: Quintella ReichertBeverly Knight, RN 09/23/2014 9:05 AM  Signature: Waynetta SandyJan Wright, RN 09/23/2014 9:05 AM  Signature:   09/23/2014 9:05 AM  Signature:  Juline PatchQuylle Jakai Onofre, LCSW 09/23/2014 9:05 AM  Signature:  Belenda CruiseKristin Drinkard, LCSW-A 09/23/2014 9:05 AM  Signature:  Leisa LenzValerie Enoch, Care Coordinator [facility] 09/23/2014 9:05 AM  Signature:   09/23/2014 9:05 AM  Signature:  Onnie BoerJennifer Clark, RN UR CM 09/23/2014  9:05 AM  Signature:    09/23/2014  9:05 AM  Signature:  09/23/2014  9:05 AM    Scribe for Treatment Team:   Juline PatchQuylle Emagene Merfeld,  09/23/2014 9:05 AM
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
## 2014-09-23 NOTE — Progress Notes (Signed)
D:  Patient's self inventory sheet, patient slept good last night, no sleep medication given.  Good appetite, normal energy level, good concentration.  Rated depression and anxiety 4, hopeless 2.  Denied withdrawals.  Denied physical problems.  Denied SI.  Denied physical problems.  Goal is to discharge today.  Rash in hands, believes it is from soap he has been using.  Needs MD note from 25th until 09/23/2014. A:  Medications administered per MD orders.  Emotional support and encouragement given patient. R:  Denied SI and HI, contracts for safety.  Denied A/V hallucinations.  Emotional support and encouragement given patient.

## 2014-09-26 NOTE — Progress Notes (Signed)
Patient Discharge Instructions:  After Visit Summary (AVS):   Faxed to:  09/26/14 Discharge Summary Note:   Faxed to:  09/26/14 Psychiatric Admission Assessment Note:   Faxed to:  09/26/14 Suicide Risk Assessment - Discharge Assessment:   Faxed to:  09/26/14 Faxed/Sent to the Next Level Care provider:  09/26/14 [facility]ax[facility] to [facility] @ [phone] Jerelene ReddenSheena E Sherrill, 09/26/2014, 3:59 PM

## 2014-11-02 ENCOUNTER — Emergency Department (HOSPITAL_COMMUNITY)
Admission: EM | Admit: 2014-11-02 | Discharge: 2014-11-02 | Disposition: A | Discharge status: Home / Self Care | Payer: Medicaid Other | Attending: Emergency Medicine | Admitting: Emergency Medicine

## 2014-11-02 ENCOUNTER — Emergency Department (HOSPITAL_COMMUNITY): Payer: Medicaid Other

## 2014-11-02 ENCOUNTER — Encounter (HOSPITAL_COMMUNITY): Payer: Self-pay | Admitting: Radiology

## 2014-11-02 DIAGNOSIS — R109 Unspecified abdominal pain: Secondary | ICD-10-CM | POA: Insufficient documentation

## 2014-11-02 DIAGNOSIS — K529 Noninfective gastroenteritis and colitis, unspecified: Secondary | ICD-10-CM | POA: Insufficient documentation

## 2014-11-02 DIAGNOSIS — F419 Anxiety disorder, unspecified: Secondary | ICD-10-CM | POA: Insufficient documentation

## 2014-11-02 DIAGNOSIS — F329 Major depressive disorder, single episode, unspecified: Secondary | ICD-10-CM | POA: Insufficient documentation

## 2014-11-02 DIAGNOSIS — Z9889 Other specified postprocedural states: Secondary | ICD-10-CM | POA: Insufficient documentation

## 2014-11-02 DIAGNOSIS — Z87442 Personal history of urinary calculi: Secondary | ICD-10-CM | POA: Insufficient documentation

## 2014-11-02 DIAGNOSIS — Z87891 Personal history of nicotine dependence: Secondary | ICD-10-CM | POA: Insufficient documentation

## 2014-11-02 DIAGNOSIS — Z79899 Other long term (current) drug therapy: Secondary | ICD-10-CM | POA: Insufficient documentation

## 2014-11-02 LAB — COMPREHENSIVE METABOLIC PANEL
ALK PHOS: 79 U/L (ref 39–117)
ALT: 17 U/L (ref 0–53)
AST: 21 U/L (ref 0–37)
Albumin: 4 g/dL (ref 3.5–5.2)
Anion gap: 7 (ref 5–15)
BUN: 11 mg/dL (ref 6–23)
CHLORIDE: 106 mmol/L (ref 96–112)
CO2: 25 mmol/L (ref 19–32)
Calcium: 8.6 mg/dL (ref 8.4–10.5)
Creatinine, Ser: 0.8 mg/dL (ref 0.50–1.35)
GLUCOSE: 84 mg/dL (ref 70–99)
Potassium: 3.8 mmol/L (ref 3.5–5.1)
Sodium: 138 mmol/L (ref 135–145)
Total Bilirubin: 0.7 mg/dL (ref 0.3–1.2)
Total Protein: 7 g/dL (ref 6.0–8.3)

## 2014-11-02 LAB — CBC
HEMATOCRIT: 41.6 % (ref 39.0–52.0)
Hemoglobin: 14 g/dL (ref 13.0–17.0)
MCH: 30.2 pg (ref 26.0–34.0)
MCHC: 33.7 g/dL (ref 30.0–36.0)
MCV: 89.8 fL (ref 78.0–100.0)
PLATELETS: 321 10*3/uL (ref 150–400)
RBC: 4.63 MIL/uL (ref 4.22–5.81)
RDW: 13.5 % (ref 11.5–15.5)
WBC: 10.4 10*3/uL (ref 4.0–10.5)

## 2014-11-02 LAB — URINE MICROSCOPIC-ADD ON

## 2014-11-02 LAB — URINALYSIS, ROUTINE W REFLEX MICROSCOPIC
Bilirubin Urine: NEGATIVE
GLUCOSE, UA: NEGATIVE mg/dL
Hgb urine dipstick: NEGATIVE
Ketones, ur: NEGATIVE mg/dL
NITRITE: NEGATIVE
Protein, ur: NEGATIVE mg/dL
Specific Gravity, Urine: 1.024 (ref 1.005–1.030)
Urobilinogen, UA: 0.2 mg/dL (ref 0.0–1.0)
pH: 5.5 (ref 5.0–8.0)

## 2014-11-02 LAB — LIPASE, BLOOD: Lipase: 27 U/L (ref 11–59)

## 2014-11-02 MED ORDER — DIPHENHYDRAMINE HCL 50 MG/ML IJ SOLN
25.0000 mg | Freq: Once | INTRAMUSCULAR | Status: AC
  Administered 2014-11-02: 25 mg via INTRAVENOUS

## 2014-11-02 MED ORDER — IOHEXOL 300 MG/ML  SOLN
50.0000 mL | Freq: Once | INTRAMUSCULAR | Status: AC | PRN
  Administered 2014-11-02: 50 mL via ORAL

## 2014-11-02 MED ORDER — DIPHENHYDRAMINE HCL 50 MG/ML IJ SOLN
INTRAMUSCULAR | Status: AC
  Filled 2014-11-02: qty 1

## 2014-11-02 MED ORDER — IOHEXOL 300 MG/ML  SOLN
100.0000 mL | Freq: Once | INTRAMUSCULAR | Status: AC | PRN
  Administered 2014-11-02: 100 mL via INTRAVENOUS

## 2014-11-02 MED ORDER — METRONIDAZOLE 500 MG PO TABS: 500.0000 mg | ORAL_TABLET | Freq: Two times a day (BID) | ORAL | Status: DC

## 2014-11-02 MED ORDER — CIPROFLOXACIN HCL 500 MG PO TABS: 500.0000 mg | ORAL_TABLET | Freq: Two times a day (BID) | ORAL | Status: DC

## 2014-11-02 MED ORDER — SODIUM CHLORIDE 0.9 % IV BOLUS (SEPSIS)
1000.0000 mL | Freq: Once | INTRAVENOUS | Status: AC
  Administered 2014-11-02: 1000 mL via INTRAVENOUS

## 2014-11-02 NOTE — ED Notes (Signed)
Pt with nausea, vomiting, diarrhea x 2 weeks. Denies fever.

## 2014-11-02 NOTE — ED Notes (Signed)
Pt with facial swelling and vomiting after IV contrast initiated. Pt returned to ED 22 and given IV Benadryl. Pt denies SOB.
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
## 2014-11-02 NOTE — Discharge Instructions (Signed)
Colitis °Colitis is inflammation of the colon. Colitis can be a short-term or long-standing (chronic) illness. Crohn's disease and ulcerative colitis are 2 types of colitis which are chronic. They usually require lifelong treatment. °CAUSES  °There are many different causes of colitis, including: °· Viruses. °· Germs (bacteria). °· Medicine reactions. °SYMPTOMS  °· Diarrhea. °· Intestinal bleeding. °· Pain. °· Fever. °· Throwing up (vomiting). °· Tiredness (fatigue). °· Weight loss. °· Bowel blockage. °DIAGNOSIS  °The diagnosis of colitis is based on examination and stool or blood tests. X-rays, CT scan, and colonoscopy may also be needed. °TREATMENT  °Treatment may include: °· Fluids given through the vein (intravenously). °· Bowel rest (nothing to eat or drink for a period of time). °· Medicine for pain and diarrhea. °· Medicines (antibiotics) that kill germs. °· Cortisone medicines. °· Surgery. °HOME CARE INSTRUCTIONS  °· Get plenty of rest. °· Drink enough water and fluids to keep your urine clear or pale yellow. °· Eat a well-balanced diet. °· Call your caregiver for follow-up as recommended. °SEEK IMMEDIATE MEDICAL CARE IF:  °· You develop chills. °· You have an oral temperature above 102° F (38.9° C), not controlled by medicine. °· You have extreme weakness, fainting, or dehydration. °· You have repeated vomiting. °· You develop severe belly (abdominal) pain or are passing bloody or tarry stools. °MAKE SURE YOU:  °· Understand these instructions. °· Will watch your condition. °· Will get help right away if you are not doing well or get worse. °Document Released: 09/15/2004 Document Revised: 10/31/2011 Document Reviewed: 12/11/2009 °ExitCare® Patient Information ©2015 [employer]. This information is not intended to replace advice given to you by your health care provider. Make sure you discuss any questions you have with your health care provider. ° °Emergency Department Resource Guide °1) Find a Doctor and  Pay Out of Pocket °Although you won't have to find out who is covered by your insurance plan, it is a good idea to ask around and get recommendations. You will then need to call the office and see if the doctor you have chosen will accept you as a new patient and what types of options they offer for patients who are self-pay. Some doctors offer discounts or will set up payment plans for their patients who do not have insurance, but you will need to ask so you aren't surprised when you get to your appointment. ° °2) Contact Your Local Health Department °Not all health departments have doctors that can see patients for sick visits, but many do, so it is worth a call to see if yours does. If you don't know where your local health department is, you can check in your phone book. The CDC also has a tool to help you locate your state's health department, and many state websites also have listings of all of their local health departments. ° °3) Find a Walk-in Clinic °If your illness is not likely to be very severe or complicated, you may want to try a walk in clinic. These are popping up all over the country in pharmacies, drugstores, and shopping centers. They're usually staffed by nurse practitioners or physician assistants that have been trained to treat common illnesses and complaints. They're usually fairly quick and inexpensive. However, if you have serious medical issues or chronic medical problems, these are probably not your best option. ° °No Primary Care Doctor: °- Call [facility] at  832-8000 - they can help you locate a primary care doctor that  accepts your   insurance, provides certain services, etc. °- Physician Referral Service- 1-800-533-3463 ° °Chronic Pain Problems: °Organization         Address  Phone   Notes  °[facility]  (336) 297-2271 Patients need to be referred by their primary care doctor.  ° °Medication Assistance: °Organization         Address  Phone   Notes  °[place] [facility] [address] °[place], [place] [address] (336) 641-8030 --Must be a resident of [place] °-- Must have NO insurance coverage whatsoever (no Medicaid/ Medicare, etc.) °-- The pt. MUST have a primary care doctor that directs their care regularly and follows them in the community °  °[facility]  (866) 331-1348   °[employer]  (888) 892-1162   ° °Agencies that provide inexpensive medical care: °Organization         Address  Phone   Notes  °[facility]  (336) 832-8035   °[facility]    (336) 832-7272   °[facility] [address] °[place], [place] [address] (336) 832-4777   °[facility] [place] [address], °[place] (336) 271-4999   °[facility]    (336) 373-0678   °[facility]    (336) 272-1050   °[facility] ° [address], [place] Phone:  (336) 832-4444, Fax:  (336) 832-4440 Hours of Operation:  9 am - 6 pm, M-F.  Also accepts Medicaid/Medicare and self-pay.  °[facility] ° [address], [place] Phone: (336) 832-3150, Fax: (336) 832-3151. Hours of Operation:  8:30 am - 5:30 pm, M-F.  Also accepts Medicaid and self-pay.  °[facility] [place] [address], [place] Phone: (336) 878-6027   °[facility] [address], [place], [place] (336)723-1848, Ext. 123 Mondays & Thursdays: 7-9 AM.  First 15 patients are seen on a first come, first serve basis. °  ° °Medicaid-accepting [place] Providers: ° °Organization         Address  Phone   Notes  °[facility] [address], [place] (336) 641-2100 Also accepts self-pay patients.  °[facility] [address], [place] ° (336) 856-9996   °[facility] [address], [place] (336) 288-8857   °[facility] [address] [place] [address], [place] (336) 299-7000   °Veita Bland [address], [place]  ° (336) 373-1557 Only accepts [place] Access Medicaid patients after they have their name applied to their card.  ° °Self-Pay (no insurance) in [place]: ° °Organization         Address  Phone   Notes  °Sickle Cell Patients, [facility] [address], [place] (336) 832-1970   °[facility] [address], [place] (336) 832-4400   °[facility] ° [address], [place] (336) 992-4800   °[facility]/Dr. Osei-Bonsu ° [address] [place] [address], [place] or [address], [place] (336) 841-8500 Phone number for both [place] and [place] locations is the same.  °[facility] [address], [place] (336) 299-0000   °[facility] [address] [place] [address],  or [address] (336) 852-7530 °(336) 878-2260   °[facility] [address], [place] (336) 350-1642, phone; (336) 294-5005, fax Sees patients 1st and 3rd Saturday of every month.  Must not qualify for public or private insurance (i.e. Medicaid, Medicare, [place] Health Choice, Veterans' Benefits) • Household income should be no more than 200% of the poverty level •The clinic cannot treat you if you are pregnant or think you are pregnant • Sexually transmitted diseases are not treated at the clinic.  ° ° °Dental Care: °Organization         Address  Phone  Notes  °[facility] [address], [place] (336) 641-6152 Accepts children up to age 21 who are enrolled in Medicaid or [place] Health Choice; pregnant women with a Medicaid card; and children who have applied for Medicaid or [place] Health Choice, but were declined, whose parents can pay a reduced fee at time of service.  °[facility] [place]  [address], [place] (336) 641-7733 Accepts children up to age 21 who are enrolled in Medicaid or [place] Health Choice; pregnant women with a Medicaid card; and children who have applied for Medicaid or [place] Health Choice, but were declined, whose parents can pay a reduced fee at time of service.  °[facility] ° [address], [place] (336) 641-4533 Patients are seen by appointment only. Walk-ins are not accepted. [facility] will see patients 18 years of age and older. °Monday - Tuesday (8am-5pm) °Most Wednesdays (8:30-5pm) °$30 per visit, cash only  °[facility] ° [address], [place] (336) 641-4533 Patients are seen by appointment only. Walk-ins are not accepted. [facility] will see patients 18 years of age and older. °One Wednesday Evening (Monthly: Volunteer Based).  $30 per visit, cash only  °[facility]  (919) 537-3737 for adults; Children under age 4, call Graduate Pediatric Dentistry at ([phone]) [phone]. Children aged 4-14, please call (919) [phone] to request a pediatric application. ° Dental services are provided in all areas of dental care including fillings, crowns and bridges, complete and partial dentures, implants, gum treatment, root canals, and extractions. Preventive care is also provided. Treatment is provided to both adults and children. °Patients are selected via a lottery and there is often a waiting list. °  °[facility] [address], °[place] ° (336) 763-8833 [email or web address] °  °[facility] [address], [place], [place] (336)723-1848, Ext. 123 Second and Fourth Thursday of each month, opens at 6:30 AM; Clinic ends at 9 AM.  Patients are seen on a first-come first-served basis, and a limited number are seen during each clinic.  ° °[facility] ° [address], [place], [place] (336) 723-7904   Eligibility Requirements °You must have lived in [place], [place], or  [place] counties for at least the last three months. °  You cannot be eligible for state or federal sponsored healthcare insurance, including Veterans Administration, Medicaid, or Medicare. °  You generally cannot be eligible for healthcare insurance through your employer.  °  How to apply: °Eligibility screenings are held every Tuesday and Wednesday afternoon from 1:00 pm until 4:00 pm. You do not need an appointment for the interview!  °[facility] [address], [place], [place] 336-631-2330   °[facility]  336-342-8273   °[facility]  336-703-3100   °[facility]  336-570-6415   ° °  Behavioral Health Resources in the Community: °Intensive Outpatient Programs °Organization         Address  Phone  Notes  °[facility] [address], [place], [place] 336-878-6098   °[facility] [address], [place], [place] 336-832-9800   °[facility] [address], [place], [place] ° 336-882-2125   °[facility] [address],  °[place], [place] 1-800-853-5163 or 336-641-4981   °Substance Abuse Resources °Organization         Address  Phone  Notes  °[facility]  336-882-2125   °[facility]  336-784-9470   °[facility]  336-285-9073   °[facility]  336-845-3988   °Residential & Outpatient Substance Abuse Program  1-800-659-3381   °Psychological Services °Organization         Address  Phone  Notes  °[facility]  336- 832-9600   °[facility]  336- 378-7881   °[facility] [address], [place] 1-800-853-5163 or 336-641-4981   ° °Mobile Crisis Teams °Organization         Address  Phone  Notes  °[facility], Mobile Crisis Care Unit  1-877-626-1772   °Assertive °[facility] ° [address] [place], [place] 336-834-9664   °Sharon DeEsch [address] °[place] 336-554-5454   ° °Self-Help/Support Groups °Organization         Address  Phone             Notes  °[facility] - variety of support groups  336- 373-1402 Call for more information  °Narcotics Anonymous (NA), [facility] [address], °[place]  2 meetings at this location  ° °Residential Treatment Programs °Organization         Address  Phone  Notes  °[facility] [address],    °[place]  1-866-801-8205   °[facility] ° [address], [place], [place] 704-293-8524   °[facility] [address], [place] 336-845-3988 Admissions: 8am-3pm M-F  °[facility] [address]    °[place], [place] 336-841-1104   °[facility] [address], [place], [place] 336-379-7146   °[facility] [address]  °[place], [place] 336-285-9073   °[facility] - Intensive Outpatient [address], , [place] 336-852-3033   °[facility] [address]  °[place], [place] 1-877-615-2722 or 336-784-9470   °[facility] [address] [place], [place] 336-227-7417 Accepts Medicaid  °[facility] [address]  ° [place] 1-800-659-3381 Substance Abuse/Addiction Treatment  ° °[place] Behavioral Health Resources °Organization         Address  Phone  Notes  °[facility]  (888) 581-9988   °Julie Brannon, PhD [address] [place], [place]   (336) 349-5553 or (336) 951-0000   °[facility]   [address] °[place], [place] (336) 349-4454   °[facility] [address], [place], [place] (336) 342-8316 Insurance/Medicaid/sponsorship through [facility]  °[facility] [address]                                    [place], [place] (336) 342-8316 Therapy/tele-psych/case  °[facility] [address]  ° [place],   [place] (336) 349-2233     °Dr. Arfeen  (336) 349-4544   °[facility] [place]  [employer] [place] [facility] 1) [address], [place] °2) [address], [place] °3)  [address], [place] (336) 349-3220 °(336) 342-7768 ° °(336) 342-8140   °[place] [facility] (336) 342-1394 or (336) 342-3537 (After Hours)    ° ° °

## 2014-11-13 ENCOUNTER — Encounter (HOSPITAL_COMMUNITY): Payer: Self-pay | Admitting: Emergency Medicine

## 2014-11-13 ENCOUNTER — Emergency Department (HOSPITAL_COMMUNITY)
Admission: EM | Admit: 2014-11-13 | Discharge: 2014-11-13 | Disposition: A | Discharge status: Home / Self Care | Payer: Medicaid Other | Attending: Emergency Medicine | Admitting: Emergency Medicine

## 2014-11-13 DIAGNOSIS — M791 Myalgia: Secondary | ICD-10-CM | POA: Insufficient documentation

## 2014-11-13 DIAGNOSIS — Z87891 Personal history of nicotine dependence: Secondary | ICD-10-CM | POA: Insufficient documentation

## 2014-11-13 DIAGNOSIS — Z8709 Personal history of other diseases of the respiratory system: Secondary | ICD-10-CM | POA: Insufficient documentation

## 2014-11-13 DIAGNOSIS — B349 Viral infection, unspecified: Secondary | ICD-10-CM | POA: Insufficient documentation

## 2014-11-13 DIAGNOSIS — F419 Anxiety disorder, unspecified: Secondary | ICD-10-CM | POA: Insufficient documentation

## 2014-11-13 DIAGNOSIS — Z87442 Personal history of urinary calculi: Secondary | ICD-10-CM | POA: Insufficient documentation

## 2014-11-13 DIAGNOSIS — F329 Major depressive disorder, single episode, unspecified: Secondary | ICD-10-CM | POA: Insufficient documentation

## 2014-11-13 MED ORDER — CYCLOBENZAPRINE HCL 10 MG PO TABS: 10.0000 mg | ORAL_TABLET | Freq: Two times a day (BID) | ORAL | Status: DC | PRN

## 2014-11-13 MED ORDER — ACETAMINOPHEN 325 MG PO TABS
650.0000 mg | ORAL_TABLET | Freq: Four times a day (QID) | ORAL | Status: DC | PRN
  Administered 2014-11-13: 650 mg via ORAL
  Filled 2014-11-13: qty 2

## 2014-11-13 MED ORDER — ACETAMINOPHEN ER 650 MG PO TBCR: 650.0000 mg | EXTENDED_RELEASE_TABLET | Freq: Three times a day (TID) | ORAL | Status: DC | PRN

## 2014-11-13 NOTE — ED Notes (Signed)
Pt asking for a doctor's note.

## 2014-11-13 NOTE — Discharge Instructions (Signed)
Take tylenol as needed for fever and headache. Take Flexeril as needed for muscle spasm. Refer to attached documents for more information. Return to the ED with worsening or concerning symptoms.
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
## 2014-11-13 NOTE — ED Notes (Signed)
Pt c/o fever, chills, nausea, and generalized body aches since this morning. Denies vomiting, diarrhea. A&Ox4.

## 2014-11-21 ENCOUNTER — Emergency Department (HOSPITAL_COMMUNITY)
Admission: EM | Admit: 2014-11-21 | Discharge: 2014-11-21 | Disposition: A | Discharge status: Home / Self Care | Payer: Medicaid Other | Attending: Emergency Medicine | Admitting: Emergency Medicine

## 2014-11-21 ENCOUNTER — Encounter (HOSPITAL_COMMUNITY): Payer: Self-pay | Admitting: Emergency Medicine

## 2014-11-21 DIAGNOSIS — Z87442 Personal history of urinary calculi: Secondary | ICD-10-CM | POA: Insufficient documentation

## 2014-11-21 DIAGNOSIS — J020 Streptococcal pharyngitis: Secondary | ICD-10-CM | POA: Insufficient documentation

## 2014-11-21 DIAGNOSIS — F329 Major depressive disorder, single episode, unspecified: Secondary | ICD-10-CM | POA: Insufficient documentation

## 2014-11-21 DIAGNOSIS — Z87891 Personal history of nicotine dependence: Secondary | ICD-10-CM | POA: Insufficient documentation

## 2014-11-21 DIAGNOSIS — F419 Anxiety disorder, unspecified: Secondary | ICD-10-CM | POA: Insufficient documentation

## 2014-11-21 LAB — RAPID STREP SCREEN (MED CTR MEBANE ONLY): Streptococcus, Group A Screen (Direct): POSITIVE — AB

## 2014-11-21 MED ORDER — PENICILLIN G BENZATHINE 1200000 UNIT/2ML IM SUSP
1.2000 10*6.[IU] | Freq: Once | INTRAMUSCULAR | Status: AC
  Administered 2014-11-21: 1.2 10*6.[IU] via INTRAMUSCULAR
  Filled 2014-11-21: qty 2
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
## 2014-11-21 NOTE — Discharge Instructions (Signed)
Salt Water Gargle This solution will help make your mouth and throat feel better. HOME CARE INSTRUCTIONS   Mix 1 teaspoon of salt in 8 ounces of warm water.  Gargle with this solution as much or often as you need or as directed. Swish and gargle gently if you have any sores or wounds in your mouth.  Do not swallow this mixture. Document Released: 05/12/2004 Document Revised: 10/31/2011 Document Reviewed: 10/03/2008 [facility] Patient Information 2015 [employer] [place]. This information is not intended to replace advice given to you by your health care provider. Make sure you discuss any questions you have with your health care provider. Strep Throat Strep throat is an infection of the throat caused by a bacteria named Streptococcus pyogenes. Your health care provider may call the infection streptococcal "tonsillitis" or "pharyngitis" depending on whether there are signs of inflammation in the tonsils or back of the throat. Strep throat is most common in children aged 5-15 years during the cold months of the year, but it can occur in people of any age during any season. This infection is spread from person to person (contagious) through coughing, sneezing, or other close contact. SIGNS AND SYMPTOMS   Fever or chills.  Painful, swollen, red tonsils or throat.  Pain or difficulty when swallowing.  White or yellow spots on the tonsils or throat.  Swollen, tender lymph nodes or "glands" of the neck or under the jaw.  Red rash all over the body (rare). DIAGNOSIS  Many different infections can cause the same symptoms. A test must be done to confirm the diagnosis so the right treatment can be given. A "rapid strep test" can help your health care provider make the diagnosis in a few minutes. If this test is not available, a light swab of the infected area can be used for a throat culture test. If a throat culture test is done, results are usually available in a day or two. TREATMENT  Strep throat is  treated with antibiotic medicine. HOME CARE INSTRUCTIONS   Gargle with 1 tsp of salt in 1 cup of warm water, 3-4 times per day or as needed for comfort.  Family members who also have a sore throat or fever should be tested for strep throat and treated with antibiotics if they have the strep infection.  Make sure everyone in your household washes their hands well.  Do not share food, drinking cups, or personal items that could cause the infection to spread to others.  You may need to eat a soft food diet until your sore throat gets better.  Drink enough water and fluids to keep your urine clear or pale yellow. This will help prevent dehydration.  Get plenty of rest.  Stay home from school, day care, or work until you have been on antibiotics for 24 hours.  Take medicines only as directed by your health care provider.  Take your antibiotic medicine as directed by your health care provider. Finish it even if you start to feel better. SEEK MEDICAL CARE IF:   The glands in your neck continue to enlarge.  You develop a rash, cough, or earache.  You cough up green, yellow-brown, or bloody sputum.  You have pain or discomfort not controlled by medicines.  Your problems seem to be getting worse rather than better.  You have a fever. SEEK IMMEDIATE MEDICAL CARE IF:   You develop any new symptoms such as vomiting, severe headache, stiff or painful neck, chest pain, shortness of breath, or trouble  swallowing. °· You develop severe throat pain, drooling, or changes in your voice. °· You develop swelling of the neck, or the skin on the neck becomes red and tender. °· You develop signs of dehydration, such as fatigue, dry mouth, and decreased urination. °· You become increasingly sleepy, or you cannot wake up completely. °MAKE SURE YOU: °· Understand these instructions. °· Will watch your condition. °· Will get help right away if you are not doing well or get worse. °Document Released:  08/05/2000 Document Revised: 12/23/2013 Document Reviewed: 10/07/2010 °[employer]® Patient Information ©2015 [employer]. This information is not intended to replace advice given to you by your health care provider. Make sure you discuss any questions you have with your health care provider. ° °

## 2014-11-21 NOTE — ED Notes (Signed)
Per pt, states he was treated for viral infection on the 24th-states he has finished his antibiotics-states throat still sore and has white spots on throat

## 2014-12-05 ENCOUNTER — Emergency Department (HOSPITAL_COMMUNITY)
Admission: EM | Admit: 2014-12-05 | Discharge: 2014-12-05 | Disposition: A | Discharge status: Home / Self Care | Payer: Federal, State, Local not specified - Other | Attending: Emergency Medicine | Admitting: Emergency Medicine

## 2014-12-05 ENCOUNTER — Encounter (HOSPITAL_COMMUNITY): Payer: Self-pay | Admitting: *Deleted

## 2014-12-05 DIAGNOSIS — Z87442 Personal history of urinary calculi: Secondary | ICD-10-CM | POA: Insufficient documentation

## 2014-12-05 DIAGNOSIS — S60512A Abrasion of left hand, initial encounter: Secondary | ICD-10-CM | POA: Insufficient documentation

## 2014-12-05 DIAGNOSIS — Z8709 Personal history of other diseases of the respiratory system: Secondary | ICD-10-CM | POA: Insufficient documentation

## 2014-12-05 DIAGNOSIS — Y92410 Unspecified street and highway as the place of occurrence of the external cause: Secondary | ICD-10-CM | POA: Insufficient documentation

## 2014-12-05 DIAGNOSIS — T07XXXA Unspecified multiple injuries, initial encounter: Secondary | ICD-10-CM

## 2014-12-05 DIAGNOSIS — S80212A Abrasion, left knee, initial encounter: Secondary | ICD-10-CM | POA: Insufficient documentation

## 2014-12-05 DIAGNOSIS — S60511A Abrasion of right hand, initial encounter: Secondary | ICD-10-CM | POA: Insufficient documentation

## 2014-12-05 DIAGNOSIS — S7002XA Contusion of left hip, initial encounter: Secondary | ICD-10-CM | POA: Insufficient documentation

## 2014-12-05 DIAGNOSIS — S80211A Abrasion, right knee, initial encounter: Secondary | ICD-10-CM | POA: Insufficient documentation

## 2014-12-05 DIAGNOSIS — F419 Anxiety disorder, unspecified: Secondary | ICD-10-CM | POA: Insufficient documentation

## 2014-12-05 DIAGNOSIS — Y998 Other external cause status: Secondary | ICD-10-CM | POA: Insufficient documentation

## 2014-12-05 DIAGNOSIS — S40212A Abrasion of left shoulder, initial encounter: Secondary | ICD-10-CM | POA: Insufficient documentation

## 2014-12-05 DIAGNOSIS — Y9389 Activity, other specified: Secondary | ICD-10-CM | POA: Insufficient documentation

## 2014-12-05 DIAGNOSIS — S3992XA Unspecified injury of lower back, initial encounter: Secondary | ICD-10-CM | POA: Insufficient documentation

## 2014-12-05 DIAGNOSIS — Z79899 Other long term (current) drug therapy: Secondary | ICD-10-CM | POA: Insufficient documentation

## 2014-12-05 DIAGNOSIS — Z87891 Personal history of nicotine dependence: Secondary | ICD-10-CM | POA: Insufficient documentation

## 2014-12-05 DIAGNOSIS — F329 Major depressive disorder, single episode, unspecified: Secondary | ICD-10-CM | POA: Insufficient documentation

## 2014-12-05 MED ORDER — BACITRACIN ZINC 500 UNIT/GM EX OINT
TOPICAL_OINTMENT | CUTANEOUS | Status: AC
  Filled 2014-12-05: qty 1.8

## 2014-12-05 MED ORDER — NAPROXEN 500 MG PO TABS: 500.0000 mg | ORAL_TABLET | Freq: Two times a day (BID) | ORAL | Status: DC

## 2014-12-05 MED ORDER — CYCLOBENZAPRINE HCL 5 MG PO TABS: 5.0000 mg | ORAL_TABLET | Freq: Three times a day (TID) | ORAL | Status: DC | PRN
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
## 2014-12-05 NOTE — Discharge Instructions (Signed)
Abrasion An abrasion is a cut or scrape of the skin. Abrasions do not extend through all layers of the skin and most heal within 10 days. It is important to care for your abrasion properly to prevent infection. CAUSES  Most abrasions are caused by falling on, or gliding across, the ground or other surface. When your skin rubs on something, the outer and inner layer of skin rubs off, causing an abrasion. DIAGNOSIS  Your caregiver will be able to diagnose an abrasion during a physical exam.  TREATMENT  Your treatment depends on how large and deep the abrasion is. Generally, your abrasion will be cleaned with water and a mild soap to remove any dirt or debris. An antibiotic ointment may be put over the abrasion to prevent an infection. A bandage (dressing) may be wrapped around the abrasion to keep it from getting dirty.  You may need a tetanus shot if:  You cannot remember when you had your last tetanus shot.  You have never had a tetanus shot.  The injury broke your skin. If you get a tetanus shot, your arm may swell, get red, and feel warm to the touch. This is common and not a problem. If you need a tetanus shot and you choose not to have one, there is a rare chance of getting tetanus. Sickness from tetanus can be serious.  HOME CARE INSTRUCTIONS   If a dressing was applied, change it at least once a day or as directed by your caregiver. If the bandage sticks, soak it off with warm water.   Wash the area with water and a mild soap to remove all the ointment 2 times a day. Rinse off the soap and pat the area dry with a clean towel.   Reapply any ointment as directed by your caregiver. This will help prevent infection and keep the bandage from sticking. Use gauze over the wound and under the dressing to help keep the bandage from sticking.   Change your dressing right away if it becomes wet or dirty.   Only take over-the-counter or prescription medicines for pain, discomfort, or fever as  directed by your caregiver.   Follow up with your caregiver within 24-48 hours for a wound check, or as directed. If you were not given a wound-check appointment, look closely at your abrasion for redness, swelling, or pus. These are signs of infection. SEEK IMMEDIATE MEDICAL CARE IF:   You have increasing pain in the wound.   You have redness, swelling, or tenderness around the wound.   You have pus coming from the wound.   You have a fever or persistent symptoms for more than 2-3 days.  You have a fever and your symptoms suddenly get worse.  You have a bad smell coming from the wound or dressing.  MAKE SURE YOU:   Understand these instructions.  Will watch your condition.  Will get help right away if you are not doing well or get worse. Document Released: 05/18/2005 Document Revised: 07/25/2012 Document Reviewed: 07/12/2011 [facility] Patient Information 2015 [place], [place]. This information is not intended to replace advice given to you by your health care provider. Make sure you discuss any questions you have with your health care provider.   Hematoma A hematoma is a collection of blood under the skin, in an organ, in a body space, in a joint space, or in other tissue. The blood can clot to form a lump that you can see and feel. The lump is often  firm and may sometimes become sore and tender. Most hematomas get better in a few days to weeks. However, some hematomas may be serious and require medical care. Hematomas can range in size from very small to very large. CAUSES  A hematoma can be caused by a blunt or penetrating injury. It can also be caused by spontaneous leakage from a blood vessel under the skin. Spontaneous leakage from a blood vessel is more likely to occur in older people, especially those taking blood thinners. Sometimes, a hematoma can develop after certain medical procedures. SIGNS AND SYMPTOMS   A firm lump on the body.  Possible pain and tenderness in  the area.  Bruising.Blue, dark blue, purple-red, or yellowish skin may appear at the site of the hematoma if the hematoma is close to the surface of the skin. For hematomas in deeper tissues or body spaces, the signs and symptoms may be subtle. For example, an intra-abdominal hematoma may cause abdominal pain, weakness, fainting, and shortness of breath. An intracranial hematoma may cause a headache or symptoms such as weakness, trouble speaking, or a change in consciousness. DIAGNOSIS  A hematoma can usually be diagnosed based on your medical history and a physical exam. Imaging tests may be needed if your health care provider suspects a hematoma in deeper tissues or body spaces, such as the abdomen, head, or chest. These tests may include ultrasonography or a CT scan.  TREATMENT  Hematomas usually go away on their own over time. Rarely does the blood need to be drained out of the body. Large hematomas or those that may affect vital organs will sometimes need surgical drainage or monitoring. HOME CARE INSTRUCTIONS   Apply ice to the injured area:   Put ice in a plastic bag.   Place a towel between your skin and the bag.   Leave the ice on for 20 minutes, 2-3 times a day for the first 1 to 2 days.   After the first 2 days, switch to using warm compresses on the hematoma.   Elevate the injured area to help decrease pain and swelling. Wrapping the area with an elastic bandage may also be helpful. Compression helps to reduce swelling and promotes shrinking of the hematoma. Make sure the bandage is not wrapped too tight.   If your hematoma is on a lower extremity and is painful, crutches may be helpful for a couple days.   Only take over-the-counter or prescription medicines as directed by your health care provider. SEEK IMMEDIATE MEDICAL CARE IF:   You have increasing pain, or your pain is not controlled with medicine.   You have a fever.   You have worsening swelling or  discoloration.   Your skin over the hematoma breaks or starts bleeding.   Your hematoma is in your chest or abdomen and you have weakness, shortness of breath, or a change in consciousness.  Your hematoma is on your scalp (caused by a fall or injury) and you have a worsening headache or a change in alertness or consciousness. MAKE SURE YOU:   Understand these instructions.  Will watch your condition.  Will get help right away if you are not doing well or get worse. Document Released: 03/22/2004 Document Revised: 04/10/2013 Document Reviewed: 01/16/2013 [facility] Patient Information 2015 [place], [place]. This information is not intended to replace advice given to you by your health care provider. Make sure you discuss any questions you have with your health care provider.  Cryotherapy Cryotherapy means treatment with cold. Ice  or gel packs can be used to reduce both pain and swelling. Ice is the most helpful within the first 24 to 48 hours after an injury or flare-up from overusing a muscle or joint. Sprains, strains, spasms, burning pain, shooting pain, and aches can all be eased with ice. Ice can also be used when recovering from surgery. Ice is effective, has very few side effects, and is safe for most people to use. PRECAUTIONS  Ice is not a safe treatment option for people with:  Raynaud phenomenon. This is a condition affecting small blood vessels in the extremities. Exposure to cold may cause your problems to return.  Cold hypersensitivity. There are many forms of cold hypersensitivity, including:  Cold urticaria. Red, itchy hives appear on the skin when the tissues begin to warm after being iced.  Cold erythema. This is a red, itchy rash caused by exposure to cold.  Cold hemoglobinuria. Red blood cells break down when the tissues begin to warm after being iced. The hemoglobin that carry oxygen are passed into the urine because they cannot combine with blood proteins fast  enough.  Numbness or altered sensitivity in the area being iced. If you have any of the following conditions, do not use ice until you have discussed cryotherapy with your caregiver:  Heart conditions, such as arrhythmia, angina, or chronic heart disease.  High blood pressure.  Healing wounds or open skin in the area being iced.  Current infections.  Rheumatoid arthritis.  Poor circulation.  Diabetes. Ice slows the blood flow in the region it is applied. This is beneficial when trying to stop inflamed tissues from spreading irritating chemicals to surrounding tissues. However, if you expose your skin to cold temperatures for too long or without the proper protection, you can damage your skin or nerves. Watch for signs of skin damage due to cold. HOME CARE INSTRUCTIONS Follow these tips to use ice and cold packs safely.  Place a dry or damp towel between the ice and skin. A damp towel will cool the skin more quickly, so you may need to shorten the time that the ice is used.  For a more rapid response, add gentle compression to the ice.  Ice for no more than 10 to 20 minutes at a time. The bonier the area you are icing, the less time it will take to get the benefits of ice.  Check your skin after 5 minutes to make sure there are no signs of a poor response to cold or skin damage.  Rest 20 minutes or more between uses.  Once your skin is numb, you can end your treatment. You can test numbness by very lightly touching your skin. The touch should be so light that you do not see the skin dimple from the pressure of your fingertip. When using ice, most people will feel these normal sensations in this order: cold, burning, aching, and numbness.  Do not use ice on someone who cannot communicate their responses to pain, such as small children or people with dementia. HOW TO MAKE AN ICE PACK Ice packs are the most common way to use ice therapy. Other methods include ice massage, ice baths,  and cryosprays. Muscle creams that cause a cold, tingly feeling do not offer the same benefits that ice offers and should not be used as a substitute unless recommended by your caregiver. To make an ice pack, do one of the following:  Place crushed ice or a bag of frozen vegetables in a  sealable plastic bag. Squeeze out the excess air. Place this bag inside another plastic bag. Slide the bag into a pillowcase or place a damp towel between your skin and the bag.  Mix 3 parts water with 1 part rubbing alcohol. Freeze the mixture in a sealable plastic bag. When you remove the mixture from the freezer, it will be slushy. Squeeze out the excess air. Place this bag inside another plastic bag. Slide the bag into a pillowcase or place a damp towel between your skin and the bag. SEEK MEDICAL CARE IF:  You develop white spots on your skin. This may give the skin a blotchy (mottled) appearance.  Your skin turns blue or pale.  Your skin becomes waxy or hard.  Your swelling gets worse. MAKE SURE YOU:   Understand these instructions.  Will watch your condition.  Will get help right away if you are not doing well or get worse. Document Released: 04/04/2011 Document Revised: 12/23/2013 Document Reviewed: 04/04/2011 [facility] Patient Information 2015 [place], [place]. This information is not intended to replace advice given to you by your health care provider. Make sure you discuss any questions you have with your health care provider.  Motor Vehicle Collision It is common to have multiple bruises and sore muscles after a motor vehicle collision (MVC). These tend to feel worse for the first 24 hours. You may have the most stiffness and soreness over the first several hours. You may also feel worse when you wake up the first morning after your collision. After this point, you will usually begin to improve with each day. The speed of improvement often depends on the severity of the collision, the number of  injuries, and the location and nature of these injuries. HOME CARE INSTRUCTIONS  Put ice on the injured area.  Put ice in a plastic bag.  Place a towel between your skin and the bag.  Leave the ice on for 15-20 minutes, 3-4 times a day, or as directed by your health care provider.  Drink enough fluids to keep your urine clear or pale yellow. Do not drink alcohol.  Take a warm shower or bath once or twice a day. This will increase blood flow to sore muscles.  You may return to activities as directed by your caregiver. Be careful when lifting, as this may aggravate neck or back pain.  Only take over-the-counter or prescription medicines for pain, discomfort, or fever as directed by your caregiver. Do not use aspirin. This may increase bruising and bleeding. SEEK IMMEDIATE MEDICAL CARE IF:  You have numbness, tingling, or weakness in the arms or legs.  You develop severe headaches not relieved with medicine.  You have severe neck pain, especially tenderness in the middle of the back of your neck.  You have changes in bowel or bladder control.  There is increasing pain in any area of the body.  You have shortness of breath, light-headedness, dizziness, or fainting.  You have chest pain.  You feel sick to your stomach (nauseous), throw up (vomit), or sweat.  You have increasing abdominal discomfort.  There is blood in your urine, stool, or vomit.  You have pain in your shoulder (shoulder strap areas).  You feel your symptoms are getting worse. MAKE SURE YOU:  Understand these instructions.  Will watch your condition.  Will get help right away if you are not doing well or get worse. Document Released: 08/08/2005 Document Revised: 12/23/2013 Document Reviewed: 01/05/2011 [facility] Patient Information 2015 [place], [place]. This information  is not intended to replace advice given to you by your health care provider. Make sure you discuss any questions you have with your  health care provider.
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
## 2015-01-29 ENCOUNTER — Encounter (HOSPITAL_COMMUNITY): Payer: Self-pay | Admitting: Emergency Medicine

## 2015-01-29 ENCOUNTER — Emergency Department (HOSPITAL_COMMUNITY)
Admission: EM | Admit: 2015-01-29 | Discharge: 2015-01-29 | Disposition: A | Discharge status: Home / Self Care | Payer: Self-pay | Attending: Emergency Medicine | Admitting: Emergency Medicine

## 2015-01-29 DIAGNOSIS — F329 Major depressive disorder, single episode, unspecified: Secondary | ICD-10-CM | POA: Insufficient documentation

## 2015-01-29 DIAGNOSIS — Z87442 Personal history of urinary calculi: Secondary | ICD-10-CM | POA: Insufficient documentation

## 2015-01-29 DIAGNOSIS — R21 Rash and other nonspecific skin eruption: Secondary | ICD-10-CM | POA: Insufficient documentation

## 2015-01-29 DIAGNOSIS — R110 Nausea: Secondary | ICD-10-CM | POA: Insufficient documentation

## 2015-01-29 DIAGNOSIS — Z791 Long term (current) use of non-steroidal anti-inflammatories (NSAID): Secondary | ICD-10-CM | POA: Insufficient documentation

## 2015-01-29 DIAGNOSIS — R369 Urethral discharge, unspecified: Secondary | ICD-10-CM | POA: Insufficient documentation

## 2015-01-29 DIAGNOSIS — J029 Acute pharyngitis, unspecified: Secondary | ICD-10-CM | POA: Insufficient documentation

## 2015-01-29 DIAGNOSIS — F419 Anxiety disorder, unspecified: Secondary | ICD-10-CM | POA: Insufficient documentation

## 2015-01-29 DIAGNOSIS — Z87891 Personal history of nicotine dependence: Secondary | ICD-10-CM | POA: Insufficient documentation

## 2015-01-29 LAB — RAPID STREP SCREEN (MED CTR MEBANE ONLY): STREPTOCOCCUS, GROUP A SCREEN (DIRECT): NEGATIVE

## 2015-01-29 LAB — URINALYSIS, ROUTINE W REFLEX MICROSCOPIC
Bilirubin Urine: NEGATIVE
Glucose, UA: NEGATIVE mg/dL
Hgb urine dipstick: NEGATIVE
Ketones, ur: NEGATIVE mg/dL
Leukocytes, UA: NEGATIVE
Nitrite: NEGATIVE
Protein, ur: NEGATIVE mg/dL
Specific Gravity, Urine: 1.016 (ref 1.005–1.030)
Urobilinogen, UA: 1 mg/dL (ref 0.0–1.0)
pH: 7 (ref 5.0–8.0)

## 2015-01-29 MED ORDER — CEFTRIAXONE SODIUM 250 MG IJ SOLR
250.0000 mg | Freq: Once | INTRAMUSCULAR | Status: AC
  Administered 2015-01-29: 250 mg via INTRAMUSCULAR
  Filled 2015-01-29: qty 250

## 2015-01-29 MED ORDER — AZITHROMYCIN 250 MG PO TABS
1000.0000 mg | ORAL_TABLET | Freq: Once | ORAL | Status: AC
  Administered 2015-01-29: 1000 mg via ORAL
  Filled 2015-01-29: qty 4

## 2015-01-29 MED ORDER — LIDOCAINE HCL 1 % IJ SOLN
INTRAMUSCULAR | Status: AC
  Administered 2015-01-29: 20 mL
  Filled 2015-01-29: qty 20

## 2015-01-29 MED ORDER — DOXYCYCLINE HYCLATE 100 MG PO CAPS: 100.0000 mg | ORAL_CAPSULE | Freq: Two times a day (BID) | ORAL | Status: DC

## 2015-01-29 NOTE — ED Notes (Signed)
Pt c/o sore throat and body aches x 2 days, states it hurts to swallow.
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
## 2015-01-29 NOTE — Discharge Instructions (Signed)
Pharyngitis °Pharyngitis is redness, pain, and swelling (inflammation) of your pharynx.  °CAUSES  °Pharyngitis is usually caused by infection. Most of the time, these infections are from viruses (viral) and are part of a cold. However, sometimes pharyngitis is caused by bacteria (bacterial). Pharyngitis can also be caused by allergies. Viral pharyngitis may be spread from person to person by coughing, sneezing, and personal items or utensils (cups, forks, spoons, toothbrushes). Bacterial pharyngitis may be spread from person to person by more intimate contact, such as kissing.  °SIGNS AND SYMPTOMS  °Symptoms of pharyngitis include:   °· Sore throat.   °· Tiredness (fatigue).   °· Low-grade fever.   °· Headache. °· Joint pain and muscle aches. °· Skin rashes. °· Swollen lymph nodes. °· Plaque-like film on throat or tonsils (often seen with bacterial pharyngitis). °DIAGNOSIS  °Your health care provider will ask you questions about your illness and your symptoms. Your medical history, along with a physical exam, is often all that is needed to diagnose pharyngitis. Sometimes, a rapid strep test is done. Other lab tests may also be done, depending on the suspected cause.  °TREATMENT  °Viral pharyngitis will usually get better in 3-4 days without the use of medicine. Bacterial pharyngitis is treated with medicines that kill germs (antibiotics).  °HOME CARE INSTRUCTIONS  °· Drink enough water and fluids to keep your urine clear or pale yellow.   °· Only take over-the-counter or prescription medicines as directed by your health care provider:   °¨ If you are prescribed antibiotics, make sure you finish them even if you start to feel better.   °¨ Do not take aspirin.   °· Get lots of rest.   °· Gargle with 8 oz of salt water (½ tsp of salt per 1 qt of water) as often as every 1-2 hours to soothe your throat.   °· Throat lozenges (if you are not at risk for choking) or sprays may be used to soothe your throat. °SEEK MEDICAL  CARE IF:  °· You have large, tender lumps in your neck. °· You have a rash. °· You cough up green, yellow-brown, or bloody spit. °SEEK IMMEDIATE MEDICAL CARE IF:  °· Your neck becomes stiff. °· You drool or are unable to swallow liquids. °· You vomit or are unable to keep medicines or liquids down. °· You have severe pain that does not go away with the use of recommended medicines. °· You have trouble breathing (not caused by a stuffy nose). °MAKE SURE YOU:  °· Understand these instructions. °· Will watch your condition. °· Will get help right away if you are not doing well or get worse. °Document Released: 08/08/2005 Document Revised: 05/29/2013 Document Reviewed: 04/15/2013 °ExitCare® Patient Information ©2015 [employer]. This information is not intended to replace advice given to you by your health care provider. Make sure you discuss any questions you have with your health care provider. ° ° °Emergency Department Resource Guide °1) Find a Doctor and Pay Out of Pocket °Although you won't have to find out who is covered by your insurance plan, it is a good idea to ask around and get recommendations. You will then need to call the office and see if the doctor you have chosen will accept you as a new patient and what types of options they offer for patients who are self-pay. Some doctors offer discounts or will set up payment plans for their patients who do not have insurance, but you will need to ask so you aren't surprised when you get to your appointment. ° °  2) Contact Your Local Health Department °Not all health departments have doctors that can see patients for sick visits, but many do, so it is worth a call to see if yours does. If you don't know where your local health department is, you can check in your phone book. The CDC also has a tool to help you locate your state's health department, and many state websites also have listings of all of their local health departments. ° °3) Find a Walk-in Clinic °If  your illness is not likely to be very severe or complicated, you may want to try a walk in clinic. These are popping up all over the country in pharmacies, drugstores, and shopping centers. They're usually staffed by nurse practitioners or physician assistants that have been trained to treat common illnesses and complaints. They're usually fairly quick and inexpensive. However, if you have serious medical issues or chronic medical problems, these are probably not your best option. ° °No Primary Care Doctor: °- Call [facility] at  832-8000 - they can help you locate a primary care doctor that  accepts your insurance, provides certain services, etc. °- Physician Referral Service- 1-800-533-3463 ° °Chronic Pain Problems: °Organization         Address  Phone   Notes  °[facility]  (336) 297-2271 Patients need to be referred by their primary care doctor.  ° °Medication Assistance: °Organization         Address  Phone   Notes  °[place] [facility] [address] °[place], [place] [address] (336) 641-8030 --Must be a resident of [place] °-- Must have NO insurance coverage whatsoever (no Medicaid/ Medicare, etc.) °-- The pt. MUST have a primary care doctor that directs their care regularly and follows them in the community °  °[facility]  (866) 331-1348   °[employer]  (888) 892-1162   ° °Agencies that provide inexpensive medical care: °Organization         Address  Phone   Notes  °[facility]  (336) 832-8035   °[facility]    (336) 832-7272   °[facility] [address] °[place], [place] [address] (336) 832-4777   °[facility] [address], °[place] (336) 271-4999   °[facility]    (336) 373-0678   °[facility]    (336) 272-1050   °[facility] ° [address], [place] Phone:  (336) 832-4444, Fax:  (336) 832-4440 Hours of  Operation:  9 am - 6 pm, M-F.  Also accepts Medicaid/Medicare and self-pay.  °[facility] ° [address], [place] Phone: (336) 832-3150, Fax: (336) [phone]. Hours of Operation:  8:30 am - 5:30 pm, M-F.  Also accepts Medicaid and self-pay.  °[facility] [place] [address], [place] Phone: (336) 878-6027   °[facility] [address], [place], [place] (336)723-1848, Ext. 123 Mondays & Thursdays: 7-9 AM.  First 15 patients are seen on a first come, first serve basis. °  ° °Medicaid-accepting [place] Providers: ° °Organization         Address  Phone   Notes  °[facility] [address], [place] (336) 641-2100 Also accepts self-pay patients.  °[facility] [address], [place] ° (336) 856-9996   °[facility] [address], [place] (336) 288-8857   °[facility] [address] [place] [address], [place] (336) 299-7000   °Veita Bland [address], [place]  ° (336) 373-1557 Only accepts [place] Access Medicaid patients after they have their name applied to their card.  ° °Self-Pay (no insurance) in [place]: ° °Organization         Address  Phone   Notes  °Sickle Cell Patients, [facility] [address], [place] (336) 832-1970   °[facility] [address], [place] (336) 832-4400   °[facility] ° [address], [place] (336) 992-4800   °[facility]/Dr. Osei-Bonsu ° [address] [place] [address], [place] or [address], [place] (336) 841-8500 Phone number for both [place] and [place] locations is the same.  °[facility] [address], [place] (336) 299-0000   °[facility] [address] [place] [address], [place] or [address] (336) 852-7530 °(336) 878-2260   °[facility] [address], [place] (336) 350-1642, phone; (336) 294-5005, fax Sees patients 1st and 3rd Saturday of every month.  Must not qualify for public or private insurance (i.e. Medicaid, Medicare, [place] Health Choice, Veterans' Benefits) • Household income should be no more than 200% of the poverty level •The clinic cannot treat you if you are pregnant or think you are pregnant • Sexually transmitted diseases are not treated at the clinic.  ° ° °Dental Care: °Organization         Address  Phone  Notes  °[facility] [address], [place] (336) 641-6152 Accepts children up to age 21 who are enrolled in Medicaid or [place] Health Choice; pregnant women with a Medicaid card; and children who have applied for Medicaid or [place] Health Choice, but were declined, whose parents can pay a reduced fee at time of service.  °[facility] [place]  [address], [place] (336) 641-7733 Accepts children up to age 21 who are enrolled in Medicaid or [place] Health Choice; pregnant women with a Medicaid card; and children who have applied for Medicaid or [place] Health Choice, but were declined, whose parents can pay a reduced fee at time of service.  °[facility] ° [address], [place] (336) 641-4533 Patients are seen by appointment only. Walk-ins are not accepted. [facility] will see patients 18 years of age and older. °Monday - Tuesday (8am-5pm) °Most Wednesdays (8:30-5pm) °$30 per visit, cash only  °[facility] ° [address], [place] (336) 641-4533 Patients are seen by appointment only. Walk-ins are not accepted. [facility] will see patients 18 years of age and older. °One Wednesday Evening (Monthly: Volunteer Based).  $30 per visit, cash only  °[facility]  (919) 537-3737 for adults; Children under age 4, call Graduate Pediatric  Dentistry at (919) [phone]. Children aged 4-14, please call (919) [phone] to request a pediatric application. ° Dental services are provided in all areas of dental care including fillings, crowns and bridges, complete and partial dentures, implants, gum treatment, root canals, and extractions. Preventive care is also provided. Treatment is provided to both adults and children. °Patients are selected via a lottery and there is often a waiting list. °  °[facility] [address], °  [place] ° (336) 763-8833 [email or web address] °  °[facility] [address], [place], [place] (336)723-1848, Ext. 123 Second and Fourth Thursday of each month, opens at 6:30 AM; Clinic ends at 9 AM.  Patients are seen on a first-come first-served basis, and a limited number are seen during each clinic.  ° °[facility] ° [address], [place], [place] (336) 723-7904   Eligibility Requirements °You must have lived in [place], [place], or [place] counties for at least the last three months. °  You cannot be eligible for state or federal sponsored healthcare insurance, including Veterans Administration, Medicaid, or Medicare. °  You generally cannot be eligible for healthcare insurance through your employer.  °  How to apply: °Eligibility screenings are held every Tuesday and Wednesday afternoon from 1:00 pm until 4:00 pm. You do not need an appointment for the interview!  °[facility] [address], [place], [place] 336-631-2330   °[place] [facility]  336-342-8273   °[place]  336-703-3100   °[facility]  336-570-6415   ° °Behavioral Health Resources in the Community: °Intensive Outpatient Programs °Organization         Address  Phone  Notes  °[facility] [address], [place], [place] 336-878-6098   °[facility] [address], [place], [place] 336-832-9800   °[facility] [address], [place], [place] ° 336-882-2125   °[facility] [address],  °[place], [place] 1-800-853-5163 or 336-641-4981   °Substance Abuse Resources °Organization         Address  Phone  Notes  °[facility]  336-882-2125   °[facility]  336-784-9470   °[facility]  336-285-9073   °[facility]  336-845-3988   °Residential & Outpatient Substance Abuse Program  1-800-659-3381   °Psychological Services °Organization         Address  Phone  Notes  °[facility]  336- 832-9600   °[facility]  336- 378-7881   °[facility] [address], [place] 1-800-853-5163 or 336-641-4981   ° °Mobile Crisis Teams °Organization         Address  Phone  Notes  °[facility], Mobile Crisis Care Unit  1-877-626-1772   °Assertive °[facility] ° [address] [place], [place] 336-834-9664   °Sharon DeEsch [address] °[place] 336-554-5454   ° °Self-Help/Support Groups °Organization         Address  Phone             Notes  °[facility] - variety of support groups  336- 373-1402 Call for more information  °Narcotics Anonymous (NA), [facility] [address], °[place]  2 meetings at this location  ° °Residential Treatment Programs °Organization         Address  Phone  Notes  °[facility] [address],    °[place]  1-866-801-8205   °[facility] ° [address], [place], [place] 704-293-8524   °[facility] [address], [place] 336-845-3988 Admissions: 8am-3pm M-F  °[facility] [address]    °[place], [place] 336-841-1104   °[facility] [address], [place], [place] 336-379-7146   °[facility] [address]  °[place], [place] 336-285-9073   °[facility] - Intensive Outpatient [address],  [place], [place] 336-852-3033   °[facility] [address]  °[place], [place] 1-877-615-2722 or 336-784-9470   °[facility] [address] [place], [place] 336-227-7417 Accepts Medicaid  °[facility] [address]  °[place] 1-800-659-3381 Substance Abuse/Addiction Treatment  ° °[place] Behavioral Health Resources °Organization         Address  Phone  Notes  °[facility]  (888) 581-9988   °Julie Brannon, PhD [address] [place], [place]   (336) 349-5553 or (336) 951-0000   °[facility]   [address] °[place], [place] (336) 349-4454   °[facility] [address], [place], [place] (336) 342-8316 Insurance/Medicaid/sponsorship through [facility]  °[facility] [address]                                    [place], [place] (336) 342-8316 Therapy/tele-psych/case  °[facility] [address]  ° [place], [place] (336) 349-2233    °Dr. Arfeen  (336) 349-4544   °[facility] [place]  [employer] [place] [facility] 1) [address], [place] °2) [address], [place] °3)  [address], [place] (336) 349-3220 °(336) 342-7768 ° °(336) 342-8140   °[place] [facility] (336) 342-1394 or (336) 342-3537 (After Hours)    ° ° ° ° °

## 2015-01-29 NOTE — Progress Notes (Signed)
Patient discharged on doxycycline, per patient, unable to afford medication.  [facility] placed patient into [facility] program and faxed MATCH letter to [facility] pharmacy at [phone] with confirmation of receipt at 2053pm.  [facility] called and spoke to patient who reports he borrowed the money and has already picked up his prescription.  Patient asked if they will give back his money?  [facility] informed patient that is [employer]'s decision to give back his money.  [facility] informed patient that if he is unable to use this MATCH letter at this time, he will be eligible for medication assistance in the future since [facility] letter was not used.  Patient thankful for services.  No further EDCM needs at this time.

## 2015-01-30 LAB — RPR: RPR: NONREACTIVE

## 2015-01-30 LAB — HIV ANTIBODY (ROUTINE TESTING W REFLEX): HIV SCREEN 4TH GENERATION: NONREACTIVE

## 2015-02-01 LAB — CULTURE, GROUP A STREP: Strep A Culture: NEGATIVE

## 2015-02-05 ENCOUNTER — Encounter (HOSPITAL_COMMUNITY): Payer: Self-pay

## 2015-02-05 ENCOUNTER — Emergency Department (HOSPITAL_COMMUNITY): Payer: Self-pay

## 2015-02-05 ENCOUNTER — Emergency Department (HOSPITAL_COMMUNITY)
Admission: EM | Admit: 2015-02-05 | Discharge: 2015-02-05 | Disposition: A | Discharge status: Home / Self Care | Payer: Self-pay | Attending: Emergency Medicine | Admitting: Emergency Medicine

## 2015-02-05 DIAGNOSIS — Z791 Long term (current) use of non-steroidal anti-inflammatories (NSAID): Secondary | ICD-10-CM | POA: Insufficient documentation

## 2015-02-05 DIAGNOSIS — Z792 Long term (current) use of antibiotics: Secondary | ICD-10-CM | POA: Insufficient documentation

## 2015-02-05 DIAGNOSIS — Z87891 Personal history of nicotine dependence: Secondary | ICD-10-CM | POA: Insufficient documentation

## 2015-02-05 DIAGNOSIS — N200 Calculus of kidney: Secondary | ICD-10-CM | POA: Insufficient documentation

## 2015-02-05 DIAGNOSIS — Z8659 Personal history of other mental and behavioral disorders: Secondary | ICD-10-CM | POA: Insufficient documentation

## 2015-02-05 DIAGNOSIS — Z79899 Other long term (current) drug therapy: Secondary | ICD-10-CM | POA: Insufficient documentation

## 2015-02-05 LAB — I-STAT CHEM 8, ED
BUN: 16 mg/dL (ref 6–20)
Calcium, Ion: 1.18 mmol/L (ref 1.12–1.23)
Chloride: 104 mmol/L (ref 101–111)
Creatinine, Ser: 1 mg/dL (ref 0.61–1.24)
Glucose, Bld: 117 mg/dL — ABNORMAL HIGH (ref 65–99)
HEMATOCRIT: 43 % (ref 39.0–52.0)
Hemoglobin: 14.6 g/dL (ref 13.0–17.0)
Potassium: 3.6 mmol/L (ref 3.5–5.1)
Sodium: 140 mmol/L (ref 135–145)
TCO2: 23 mmol/L (ref 0–100)

## 2015-02-05 LAB — URINALYSIS, ROUTINE W REFLEX MICROSCOPIC
Bilirubin Urine: NEGATIVE
GLUCOSE, UA: NEGATIVE mg/dL
Hgb urine dipstick: NEGATIVE
Ketones, ur: NEGATIVE mg/dL
Leukocytes, UA: NEGATIVE
Nitrite: NEGATIVE
Protein, ur: NEGATIVE mg/dL
SPECIFIC GRAVITY, URINE: 1.016 (ref 1.005–1.030)
Urobilinogen, UA: 0.2 mg/dL (ref 0.0–1.0)
pH: 6.5 (ref 5.0–8.0)

## 2015-02-05 MED ORDER — KETOROLAC TROMETHAMINE 30 MG/ML IJ SOLN
30.0000 mg | Freq: Once | INTRAMUSCULAR | Status: AC
  Administered 2015-02-05: 30 mg via INTRAVENOUS
  Filled 2015-02-05: qty 1

## 2015-02-05 MED ORDER — ONDANSETRON 4 MG PO TBDP
4.0000 mg | ORAL_TABLET | Freq: Three times a day (TID) | ORAL | Status: DC | PRN
Start: 2015-02-05 — End: 2015-05-11

## 2015-02-05 MED ORDER — MORPHINE SULFATE 4 MG/ML IJ SOLN
4.0000 mg | Freq: Once | INTRAMUSCULAR | Status: AC
  Administered 2015-02-05: 4 mg via INTRAVENOUS
  Filled 2015-02-05: qty 1

## 2015-02-05 MED ORDER — KETOROLAC TROMETHAMINE 30 MG/ML IJ SOLN
INTRAMUSCULAR | Status: AC
  Filled 2015-02-05: qty 1

## 2015-02-05 MED ORDER — ONDANSETRON HCL 4 MG/2ML IJ SOLN
4.0000 mg | Freq: Once | INTRAMUSCULAR | Status: AC
  Administered 2015-02-05: 4 mg via INTRAVENOUS
  Filled 2015-02-05: qty 2

## 2015-02-05 MED ORDER — HYDROCODONE-ACETAMINOPHEN 5-325 MG PO TABS: 1.0000 | ORAL_TABLET | Freq: Two times a day (BID) | ORAL | Status: DC | PRN

## 2015-02-05 NOTE — ED Notes (Signed)
Patient reports he began having right flank pain tonight and noticed blood in his urine.  History of kidney stones.
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
## 2015-02-05 NOTE — Discharge Instructions (Signed)
Kidney Stones [name], You have a kidney stone on the right side.  Take motrin 800mg  every 8 hours as needed for pain.  If your pain gets severe then take norco.  Use zofran as needed for nausea.  See urology within 3 days for close follow up. If symptoms worsen, come back to the ED immediately.  Thank you. Kidney stones (urolithiasis) are solid masses that form inside your kidneys. The intense pain is caused by the stone moving through the kidney, ureter, bladder, and urethra (urinary tract). When the stone moves, the ureter starts to spasm around the stone. The stone is usually passed in your pee (urine).  HOME CARE  Drink enough fluids to keep your pee clear or pale yellow. This helps to get the stone out.  Strain all pee through the provided strainer. Do not pee without peeing through the strainer, not even once. If you pee the stone out, catch it in the strainer. The stone may be as small as a grain of salt. Take this to your doctor. This will help your doctor figure out what you can do to try to prevent more kidney stones.  Only take medicine as told by your doctor.  Follow up with your doctor as told.  Get follow-up X-rays as told by your doctor. GET HELP IF: You have pain that gets worse even if you have been taking pain medicine. GET HELP RIGHT AWAY IF:   Your pain does not get better with medicine.  You have a fever or shaking chills.  Your pain increases and gets worse over 18 hours.  You have new belly (abdominal) pain.  You feel faint or pass out.  You are unable to pee. MAKE SURE YOU:   Understand these instructions.  Will watch your condition.  Will get help right away if you are not doing well or get worse. Document Released: 01/25/2008 Document Revised: 04/10/2013 Document Reviewed: 01/09/2013 [facility] Patient Information 2015 [place], [place]. This information is not intended to replace advice given to you by your health care provider. Make sure you discuss any  questions you have with your health care provider.

## 2015-02-05 NOTE — ED Notes (Signed)
C/o RLQ pain, stabbing, also R lower back and nv. (denies: fever, sob, diarrhea or other sx), took 2 naproxen at 2300 w/o relief. Vomited x3 since onset at 2300. Last ate 2000. Last BM Wednesday am (normal).  Mentions possible blood in urine. H/o kidney stones and abd hernia surgery.

## 2015-04-17 IMAGING — CT CT ABD-PELV W/ CM
1 of 2 series · 14 of 32 positions shown, 18 images · IV contrast (OMNIPAQUE 300)
Comparison: 11/09/05

CLINICAL DATA: History of kidney stones and prior umbilical hernia
repair. Now with vomiting and diarrhea for 2 weeks

EXAM:
CT ABDOMEN AND PELVIS WITH CONTRAST
TECHNIQUE: Multidetector CT imaging of the abdomen and pelvis was performed
using the standard protocol following bolus administration of
intravenous contrast.
CONTRAST:  50mL OMNIPAQUE IOHEXOL 300 MG/ML SOLN, 100mL OMNIPAQUE
IOHEXOL 300 MG/ML SOLN

[Series 2: abd/pel with · axial · 0.69mm/px · z∈[-481,-71]mm · 14 of 90 slices shown, 18 images]
[im 4/90  soft-tissue]
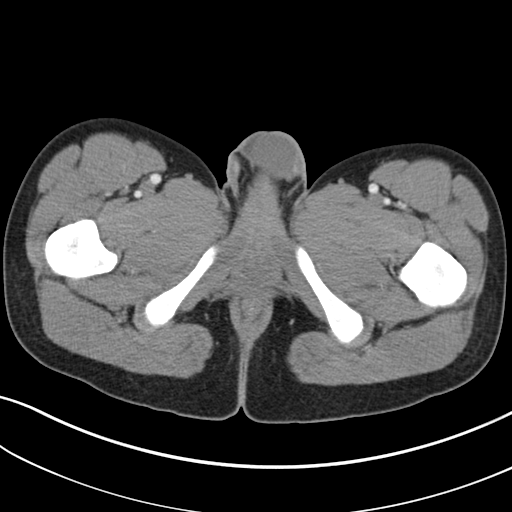
[im 4/90  bone]
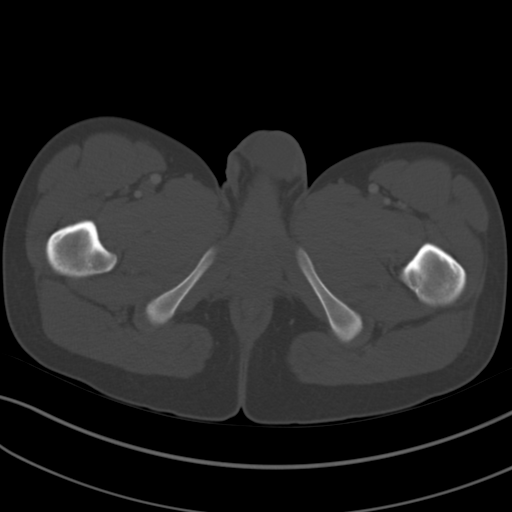
[im 12/90  soft-tissue]
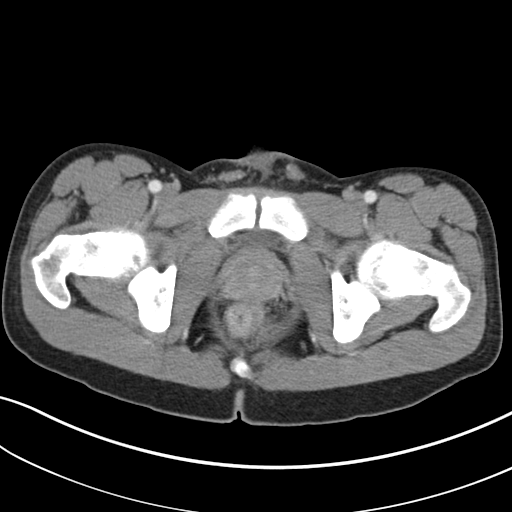
[im 19/90  soft-tissue]
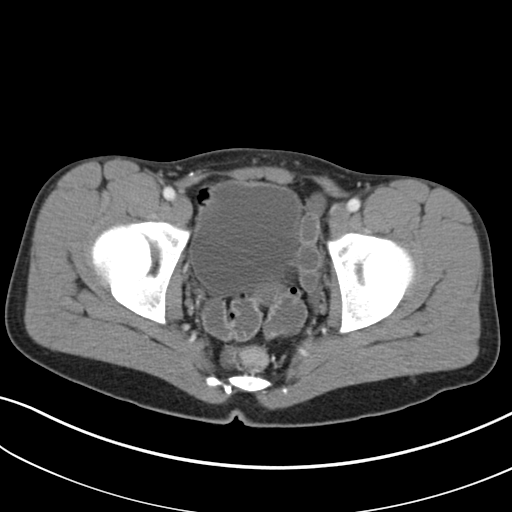
[im 26/90  soft-tissue]
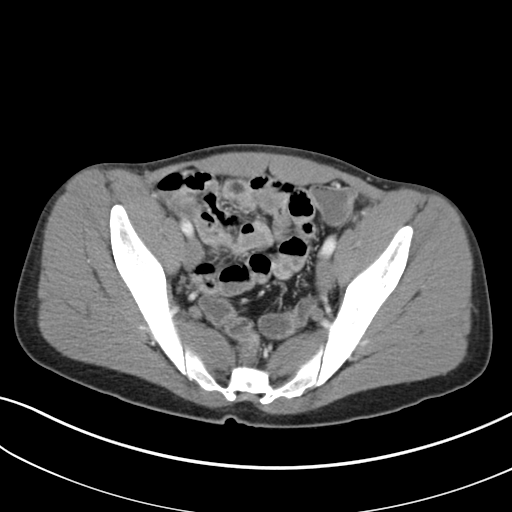
[im 34/90  soft-tissue]
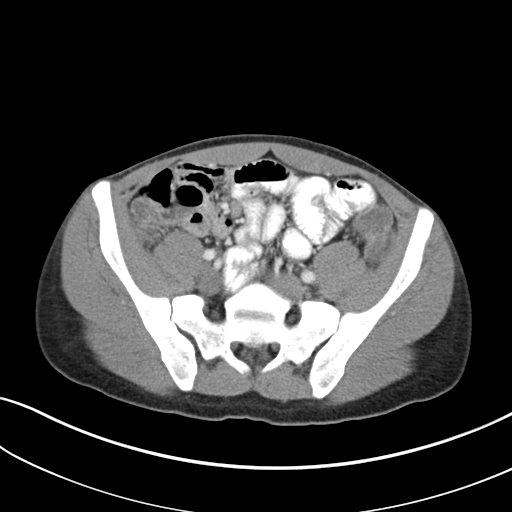
[im 41/90  soft-tissue]
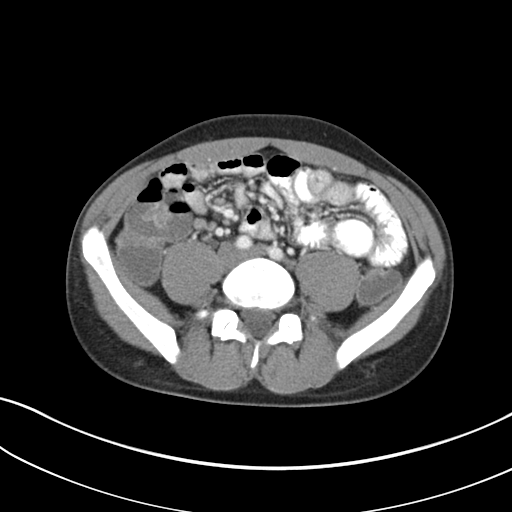
[im 49/90  soft-tissue]
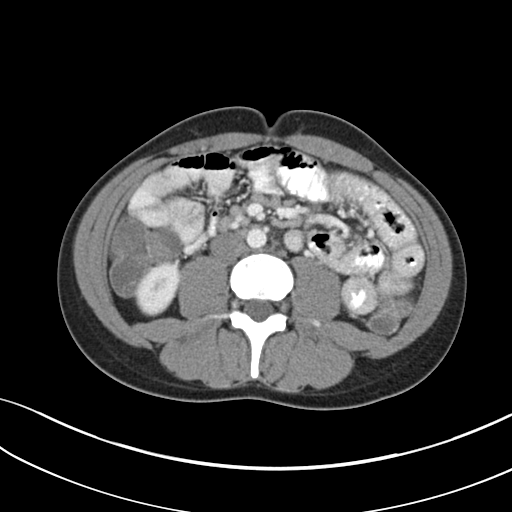
[im 56/90  soft-tissue]
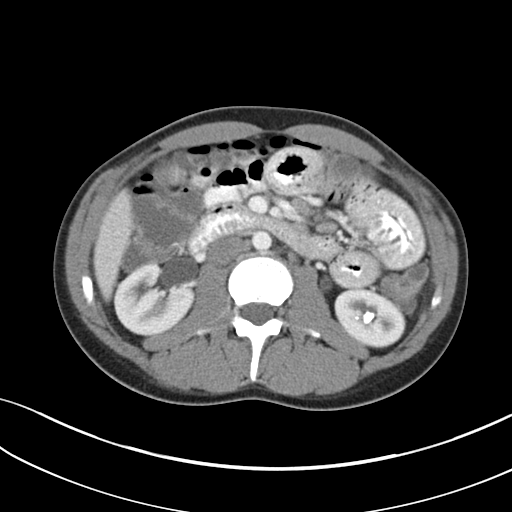
[im 64/90  soft-tissue]
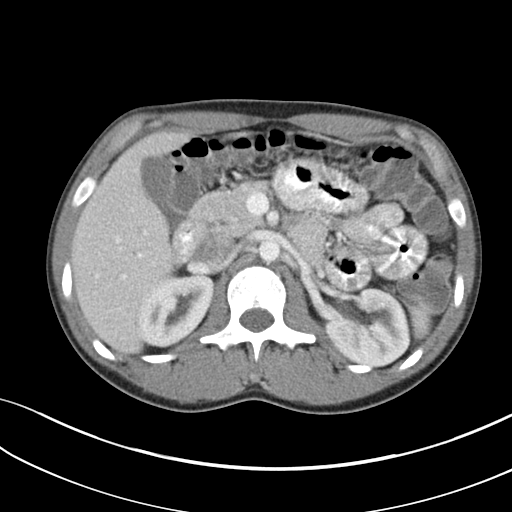
[im 64/90  bone]
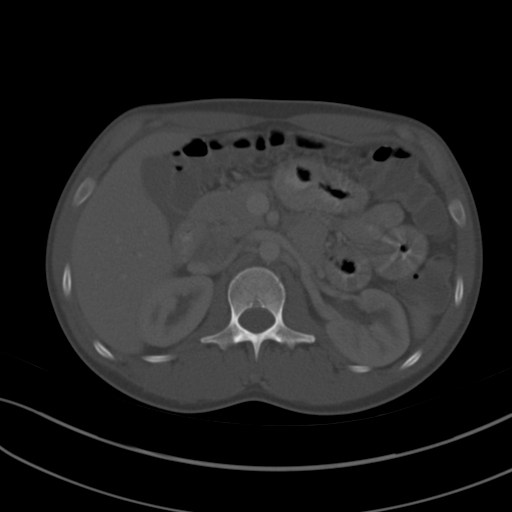
[im 71/90  soft-tissue]
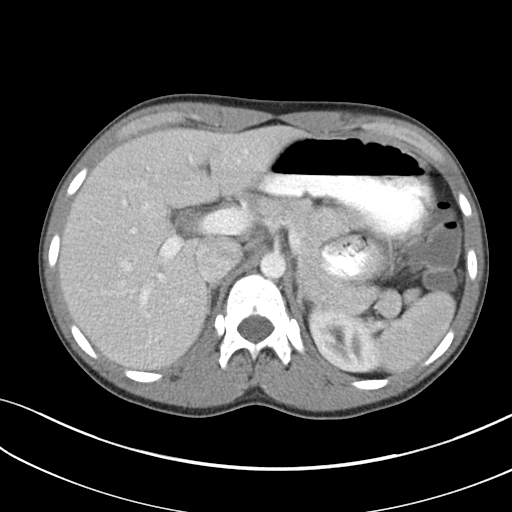
[im 75/90  lung]
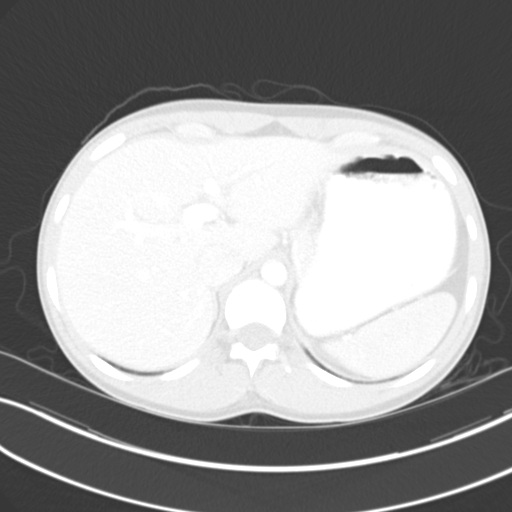
[im 78/90  soft-tissue]
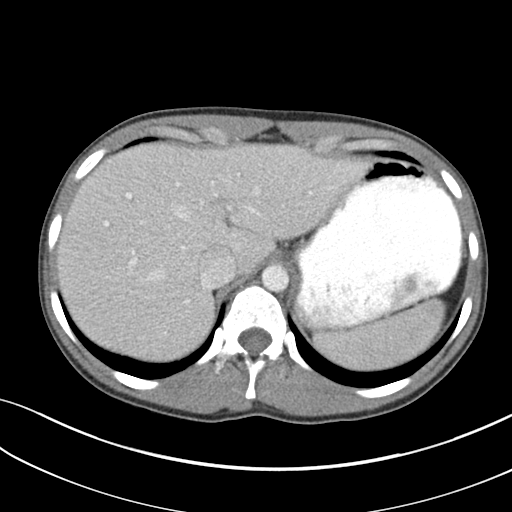
[im 78/90  lung]
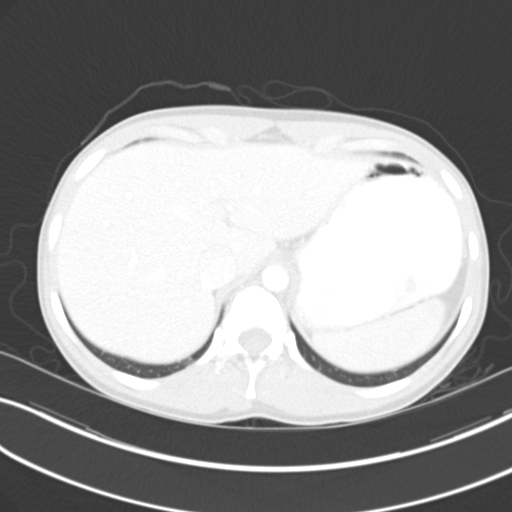
[im 82/90  lung]
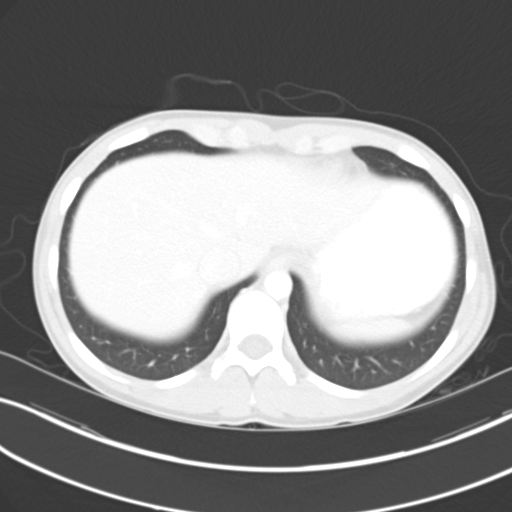
[im 86/90  soft-tissue]
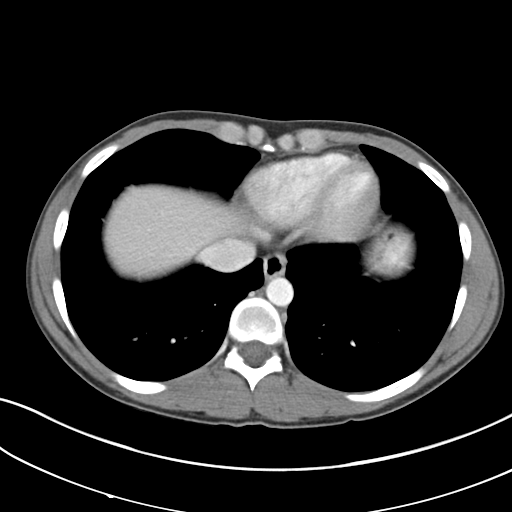
[im 86/90  lung]
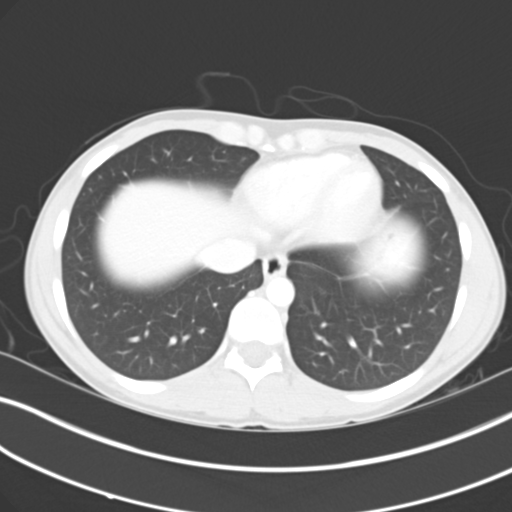

[14 of 32 positions shown; findings below may reference images not displayed]

FINDINGS: Lower chest:  No pleural effusion.  The lung bases appear clear.

Hepatobiliary: No suspicious liver abnormality. The gallbladder is
normal. No biliary dilatation.

Pancreas: Normal appearance of the pancreas.

Spleen: Negative.

Adrenals/Urinary Tract: The adrenal glands are both normal.
Nonobstructing calculus is identified within the lower pole of right
kidney measuring 4 mm. Multiple left renal stones identified. Within
the inferior pole there is a stone measuring 9 mm. Cyst is
identified within the upper pole of the left kidney measuring
cm. The urinary bladder appears normal.

Stomach/Bowel: The stomach is unremarkable. Abnormal appearance of
the proximal jejunal bowel loops which exhibit marked wall
thickening measuring up to 10 mm in thickness. No significant
surrounding fat stranding or free fluid. No evidence for
pneumatosis. Abnormal bowel loops are nonobstructing and there is
enteric contrast material identified distal to the affected bowel
loops. Normal appearance of the colon.

Vascular/Lymphatic: Normal appearance of the abdominal aorta. No
enlarged retroperitoneal or mesenteric adenopathy. No enlarged
pelvic or inguinal lymph nodes.

Reproductive: The prostate gland and seminal vesicles are
unremarkable.

Other: There is no ascites or focal fluid collections within the
abdomen or pelvis.

Musculoskeletal: Review of the visualized osseous structures are
unremarkable.
IMPRESSION: 1. Abnormal appearance of the proximal jejunal bowel loops which
exhibit marked wall thickening. There is no associated bowel
obstruction, pneumatosis or abscess. No perforation. In the acute
setting findings likely reflect inflammatory or infectious
enteritis. Inflammatory bowel disease is not excluded.
2. Bilateral nonobstructing renal calculi.

## 2015-05-04 ENCOUNTER — Encounter (HOSPITAL_COMMUNITY): Payer: Self-pay | Admitting: *Deleted

## 2015-05-04 ENCOUNTER — Inpatient Hospital Stay (HOSPITAL_COMMUNITY)
Admission: AD | Admit: 2015-05-04 | Discharge: 2015-05-11 | DRG: 885 | Disposition: A | Discharge status: Home / Self Care | Payer: Federal, State, Local not specified - Other | Attending: Psychiatry | Admitting: Psychiatry

## 2015-05-04 DIAGNOSIS — F3164 Bipolar disorder, current episode mixed, severe, with psychotic features: Principal | ICD-10-CM | POA: Diagnosis present

## 2015-05-04 DIAGNOSIS — F4310 Post-traumatic stress disorder, unspecified: Secondary | ICD-10-CM | POA: Diagnosis present

## 2015-05-04 DIAGNOSIS — F319 Bipolar disorder, unspecified: Secondary | ICD-10-CM | POA: Diagnosis present

## 2015-05-04 DIAGNOSIS — F3160 Bipolar disorder, current episode mixed, unspecified: Secondary | ICD-10-CM | POA: Diagnosis not present

## 2015-05-04 DIAGNOSIS — R45851 Suicidal ideations: Secondary | ICD-10-CM | POA: Diagnosis not present

## 2015-05-04 DIAGNOSIS — Z79899 Other long term (current) drug therapy: Secondary | ICD-10-CM

## 2015-05-04 DIAGNOSIS — F314 Bipolar disorder, current episode depressed, severe, without psychotic features: Secondary | ICD-10-CM | POA: Diagnosis present

## 2015-05-04 DIAGNOSIS — F1210 Cannabis abuse, uncomplicated: Secondary | ICD-10-CM

## 2015-05-04 LAB — COMPREHENSIVE METABOLIC PANEL
ALBUMIN: 4.2 g/dL (ref 3.5–5.0)
ALK PHOS: 93 U/L (ref 38–126)
ALT: 12 U/L — ABNORMAL LOW (ref 17–63)
ANION GAP: 8 (ref 5–15)
AST: 16 U/L (ref 15–41)
BILIRUBIN TOTAL: 0.4 mg/dL (ref 0.3–1.2)
BUN: 13 mg/dL (ref 6–20)
CALCIUM: 9.1 mg/dL (ref 8.9–10.3)
CO2: 27 mmol/L (ref 22–32)
Chloride: 106 mmol/L (ref 101–111)
Creatinine, Ser: 0.79 mg/dL (ref 0.61–1.24)
GFR calc Af Amer: >60 mL/min (ref >60)
GFR calc non Af Amer: >60 mL/min (ref >60)
GLUCOSE: 84 mg/dL (ref 65–99)
Potassium: 3.9 mmol/L (ref 3.5–5.1)
SODIUM: 141 mmol/L (ref 135–145)
Total Protein: 6.8 g/dL (ref 6.5–8.1)

## 2015-05-04 LAB — URINALYSIS, ROUTINE W REFLEX MICROSCOPIC
Bilirubin Urine: NEGATIVE
GLUCOSE, UA: NEGATIVE mg/dL
Hgb urine dipstick: NEGATIVE
Ketones, ur: NEGATIVE mg/dL
LEUKOCYTES UA: NEGATIVE
NITRITE: NEGATIVE
PH: 6 (ref 5.0–8.0)
Protein, ur: NEGATIVE mg/dL
SPECIFIC GRAVITY, URINE: 1.017 (ref 1.005–1.030)
Urobilinogen, UA: 0.2 mg/dL (ref 0.0–1.0)

## 2015-05-04 LAB — CBC
HEMATOCRIT: 42.6 % (ref 39.0–52.0)
HEMOGLOBIN: 14.4 g/dL (ref 13.0–17.0)
MCH: 31.3 pg (ref 26.0–34.0)
MCHC: 33.8 g/dL (ref 30.0–36.0)
MCV: 92.6 fL (ref 78.0–100.0)
Platelets: 231 10*3/uL (ref 150–400)
RBC: 4.6 MIL/uL (ref 4.22–5.81)
RDW: 13.6 % (ref 11.5–15.5)
WBC: 6 10*3/uL (ref 4.0–10.5)

## 2015-05-04 LAB — SALICYLATE LEVEL: Salicylate Lvl: <4 mg/dL (ref 2.8–30.0)

## 2015-05-04 LAB — RAPID URINE DRUG SCREEN, HOSP PERFORMED
AMPHETAMINES: NOT DETECTED
BENZODIAZEPINES: NOT DETECTED
Barbiturates: NOT DETECTED
COCAINE: NOT DETECTED
Opiates: NOT DETECTED
Tetrahydrocannabinol: POSITIVE — AB

## 2015-05-04 LAB — ACETAMINOPHEN LEVEL

## 2015-05-04 LAB — ETHANOL

## 2015-05-04 MED ORDER — TRAZODONE HCL 50 MG PO TABS
50.0000 mg | ORAL_TABLET | Freq: Every day | ORAL | Status: DC
  Administered 2015-05-04 – 2015-05-10 (×7): 50 mg via ORAL
  Filled 2015-05-04 (×10): qty 1

## 2015-05-04 MED ORDER — HYDROXYZINE HCL 25 MG PO TABS: 25.0000 mg | ORAL_TABLET | Freq: Three times a day (TID) | ORAL | Status: DC | PRN

## 2015-05-04 MED ORDER — CITALOPRAM HYDROBROMIDE 20 MG PO TABS
20.0000 mg | ORAL_TABLET | Freq: Every day | ORAL | Status: DC
  Administered 2015-05-04 – 2015-05-11 (×8): 20 mg via ORAL
  Filled 2015-05-04 (×10): qty 1

## 2015-05-04 MED ORDER — GABAPENTIN 300 MG PO CAPS
300.0000 mg | ORAL_CAPSULE | Freq: Three times a day (TID) | ORAL | Status: DC
  Administered 2015-05-04 – 2015-05-11 (×23): 300 mg via ORAL
  Filled 2015-05-04 (×32): qty 1

## 2015-05-04 MED ORDER — ALUM & MAG HYDROXIDE-SIMETH 200-200-20 MG/5ML PO SUSP
30.0000 mL | ORAL | Status: DC | PRN
  Administered 2015-05-04: 30 mL via ORAL
  Filled 2015-05-04: qty 30

## 2015-05-04 MED ORDER — ACETAMINOPHEN 325 MG PO TABS: 650.0000 mg | ORAL_TABLET | Freq: Four times a day (QID) | ORAL | Status: DC | PRN

## 2015-05-04 MED ORDER — INFLUENZA VAC SPLIT QUAD 0.5 ML IM SUSY
0.5000 mL | PREFILLED_SYRINGE | Freq: Once | INTRAMUSCULAR | Status: DC
  Filled 2015-05-04: qty 0.5

## 2015-05-04 MED ORDER — QUETIAPINE FUMARATE 25 MG PO TABS
25.0000 mg | ORAL_TABLET | Freq: Four times a day (QID) | ORAL | Status: DC | PRN
  Administered 2015-05-04 – 2015-05-07 (×6): 25 mg via ORAL
  Filled 2015-05-04 (×6): qty 1

## 2015-05-04 MED ORDER — DIVALPROEX SODIUM 500 MG PO DR TAB
500.0000 mg | DELAYED_RELEASE_TABLET | ORAL | Status: DC
  Administered 2015-05-04 – 2015-05-06 (×5): 500 mg via ORAL
  Filled 2015-05-04 (×9): qty 1

## 2015-05-04 MED ORDER — PNEUMOCOCCAL VAC POLYVALENT 25 MCG/0.5ML IJ INJ
0.5000 mL | INJECTION | Freq: Once | INTRAMUSCULAR | Status: AC
  Administered 2015-05-04: 0.5 mL via INTRAMUSCULAR

## 2015-05-04 MED ORDER — MAGNESIUM HYDROXIDE 400 MG/5ML PO SUSP: 30.0000 mL | Freq: Every day | ORAL | Status: DC | PRN

## 2015-05-04 MED ORDER — QUETIAPINE FUMARATE 200 MG PO TABS
200.0000 mg | ORAL_TABLET | Freq: Every day | ORAL | Status: DC
  Administered 2015-05-04: 200 mg via ORAL
  Filled 2015-05-04 (×3): qty 1

## 2015-05-04 MED ORDER — HYDROXYZINE HCL 25 MG PO TABS
25.0000 mg | ORAL_TABLET | Freq: Three times a day (TID) | ORAL | Status: DC | PRN
  Administered 2015-05-04 – 2015-05-11 (×18): 25 mg via ORAL
  Filled 2015-05-04 (×11): qty 1
  Filled 2015-05-04: qty 6
  Filled 2015-05-04 (×7): qty 1

## 2015-05-04 MED ORDER — HYDROXYZINE HCL 25 MG PO TABS: 25.0000 mg | ORAL_TABLET | Freq: Four times a day (QID) | ORAL | Status: DC | PRN

## 2015-05-04 NOTE — Progress Notes (Signed)
"  It's embarrassing to having to keep coming back here over and over since adolescents. Am I doing the right thing by coming in here and risk loosing my job?"  When asked the circumstances surrounding his adm, pt stated, "It's a lot". Stated "well it's not a lot going on, it's just what I'm going thru". Pt admits to having SI and thoughts of harming others. Stated that he thought moving away would solve the problem, but he continues to have the thoughts. States he and his boyfriend recently broke up because of the way the pt treats other people.  Pt denied A/V.   Addendum: Toward the end of the assessment pt became irritated. Pt placed his head in his hands and stated, "you're looking at me like I'm calm. You just don't know I want to tear up this whole fucking place".  Writer commended pt for controlling his behavior. Stated, "you don't believe me. It's not funny". Writer attempted to assure pt that she wasn't judging him and stating "I'm not judging or laughing at you. I'm treating you with respect like I do with all the patients. We're sitting here talking behaving like adults". Pt stated, "see now you're telling me I need to act like an adult".  Pt also admits at this time to hearing things, where as it was initially denied.

## 2015-05-04 NOTE — Tx Team (Signed)
Initial Interdisciplinary Treatment Plan   PATIENT STRESSORS: Financial difficulties Loss of relationship with boyfriend Medication change or noncompliance Occupational concerns Substance abuse   PATIENT STRENGTHS: Ability for insight Motivation for treatment/growth Physical Health Special hobby/interest Work skills   PROBLEM LIST: Problem List/Patient Goals Date to be addressed Date deferred Reason deferred Estimated date of resolution                     "I think it's time that I try to get disability" 05/04/15     "I want to go do things that can help me with stressors of the world" 05/04/15     "depression" 05/04/15     Suicidal ideation 05/04/15     Anger issues 05/04/15            DISCHARGE CRITERIA:  Ability to meet basic life and health needs Adequate post-discharge living arrangements Improved stabilization in mood, thinking, and/or behavior Medical problems require only outpatient monitoring Motivation to continue treatment in a less acute level of care Need for constant or close observation no longer present Reduction of life-threatening or endangering symptoms to within safe limits Safe-care adequate arrangements made Verbal commitment to aftercare and medication compliance  PRELIMINARY DISCHARGE PLAN: Attend aftercare/continuing care group Participate in family therapy Placement in alternative living arrangements  PATIENT/FAMIILY INVOLVEMENT: This treatment plan has been presented to and reviewed with the patient, [name], and/or family member.  The patient and family have been given the opportunity to ask questions and make suggestions.  [name] 05/04/2015, 3:00 AM

## 2015-05-04 NOTE — BHH Group Notes (Signed)
[facility] LCSW Aftercare Discharge Planning Group Note  05/04/2015 8:45 AM  Pt did not attend, declined invitation.   Chad Cordial, LCSWA 05/04/2015 9:27 AM
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
## 2015-05-04 NOTE — Progress Notes (Signed)
Patient ID: [name], male   DOB: April 15, 1996, 19 y.o.   MRN: [id number] D: Client visible on the unit, sitting in dayroom watching TV, reports depression decreased to "4" "just going through" "thinking about things outside" reports depression "10" of 10. A: Writer provided emotional support, encouraged client to use coping skills, use positive affirmations. Client encouraged to think of 2 positive things about himself. Writer reviewed medications, administered as ordered. R: Client is safe on the unit, noted 2 positive things "I try to make people laugh" "I'm a hard worker" client attended group.

## 2015-05-04 NOTE — [facility] Group Notes (Signed)
[facility] LCSW Group Therapy  05/04/2015 1:15pm  Pt did not attend, declined invitation.  Chad Cordial, LCSWA 05/04/2015 3:36 PM
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
## 2015-05-04 NOTE — Progress Notes (Signed)
Recreation Therapy Notes  Date: 09.12.2016 Time: 9:30am Location: [address] Hall Group room   Group Topic: Stress Management  Goal Area(s) Addresses:  Patient will actively participate in stress management techniques presented during session.   Behavioral Response: Did not attend.   [name] L Tashai Catino, LRT/CTRS  [name] L 05/04/2015 11:48 AM 

## 2015-05-04 NOTE — Progress Notes (Signed)
Patient ID: [name], male   DOB: 1996-04-12, 19 y.o.   MRN: [id number]  D: Patient pleasant on approach this am. He reported increased SI and thoughts to harm other people. Reports being off medication but accepted that they put him back on the medication that he was on before when here. Appears drowsy since he came during third shift hours. Requested for him to give another urine sample since lab said that the container was leaking. A: Staff will monitor on q 15 minute checks, follow treatment plan, and give medications as ordered. R: Cooperative on the unit and took medication.

## 2015-05-04 NOTE — Progress Notes (Signed)
NUTRITION ASSESSMENT  Pt identified as at risk on the Malnutrition Screen Tool  INTERVENTION: 1. Educated patient on the importance of nutrition and encouraged intake of food and beverages. 2. Discussed weight goals. 3. Supplements: none  NUTRITION DIAGNOSIS: Unintentional weight loss related to sub-optimal intake as evidenced by pt report.   Goal: Pt to meet >/= 90% of their estimated nutrition needs.  Monitor:  PO intake  Assessment:  Pt seen for MST. Pt was admitted overnight/early this AM; did not awake pt due to this. Per note entered at time of admission, pt has been admitted to [facility] in the past.   Per weight hx review, he has lost 9 lbs (6% body weight) in the past 3 months which is not significant for time frame.    19 y.o. male  Height: Ht Readings from Last 1 Encounters:  05/04/15 5' 8.5" (1.74 m)    Weight: Wt Readings from Last 1 Encounters:  05/04/15 136 lb (61.689 kg)    Weight Hx: Wt Readings from Last 10 Encounters:  05/04/15 136 lb (61.689 kg)  02/05/15 145 lb (65.772 kg)  09/15/14 147 lb (66.679 kg)  09/08/14 146 lb 8 oz (66.452 kg)  09/06/14 145 lb 3.2 oz (65.862 kg)  08/26/14 144 lb (65.318 kg)  10/07/12 145 lb (65.772 kg) (39 %*, Z = -0.29)  01/10/12 141 lb (63.957 kg) (37 %*, Z = -0.33)  06/10/11 132 lb 3.2 oz (59.966 kg) (27 %*, Z = -0.62)  06/02/11 134 lb 3.2 oz (60.873 kg) (31 %*, Z = -0.51)   * Growth percentiles are based on CDC 2-20 Years data.    BMI:  Body mass index is 20.38 kg/(m^2). Pt meets criteria for normal weight based on current BMI.  Estimated Nutritional Needs: Kcal: 25-30 kcal/kg Protein: > 1 gram protein/kg Fluid: 1 ml/kcal  Diet Order: Diet regular Room service appropriate?: Yes; Fluid consistency:: Thin Pt is also offered choice of unit snacks mid-morning and mid-afternoon.  Pt is eating as desired.   Lab results and medications reviewed.      Trenton Gammon, RD, LDN Inpatient Clinical Dietitian Pager  # [phone] After hours/weekend pager # [phone]

## 2015-05-04 NOTE — Progress Notes (Signed)
Adult Psychoeducational Group Note  Date:  05/05/2015 Time:  12:00 AM  Group Topic/Focus:  Wrap-Up Group:   The focus of this group is to help patients review their daily goal of treatment and discuss progress on daily workbooks.  Participation Level:  None  Participation Quality:  Patient did not want to talk   Affect:  Patient did not want to talk  Cognitive:  Patient refused to talk   Insight: None  Engagement in Group:  Patient refused to talk  Modes of Intervention:  Patient refused to talk  Additional Comments:  Patient attend group but refused to talk.   Blain Hunsucker L Henrik Orihuela 05/05/2015, 12:00 AM

## 2015-05-05 LAB — HEMOGLOBIN A1C
Hgb A1c MFr Bld: 5.4 % (ref 4.8–5.6)
Mean Plasma Glucose: 108 mg/dL

## 2015-05-05 LAB — LIPID PANEL
Cholesterol: 115 mg/dL (ref 0–200)
HDL: 41 mg/dL (ref >40)
LDL CALC: 61 mg/dL (ref 0–99)
TRIGLYCERIDES: 65 mg/dL (ref <150)
Total CHOL/HDL Ratio: 2.8 RATIO
VLDL: 13 mg/dL (ref 0–40)

## 2015-05-05 LAB — TSH: TSH: 0.872 u[IU]/mL (ref 0.350–4.500)

## 2015-05-05 MED ORDER — QUETIAPINE FUMARATE 100 MG PO TABS
100.0000 mg | ORAL_TABLET | Freq: Every day | ORAL | Status: DC
  Administered 2015-05-05: 100 mg via ORAL
  Filled 2015-05-05 (×4): qty 1

## 2015-05-05 NOTE — [facility] Group Notes (Signed)
[facility] Group Notes:  (Nursing/MHT/Case Management/Adjunct)  Date:  05/05/2015  Time:  0900  Type of Therapy:  Nurse Education - SMART Method of Goal Setting  Participation Level:  None  Participation Quality:  Resistant  Affect:  Depressed and Flat  Cognitive:  Alert  Insight:  Limited  Engagement in Group:  None  Modes of Intervention:  Discussion and Education  Summary of Progress/Problems: Patient educated on the SMART method for successful goal setting and attainment. Patient chose not to answer, share goal for today or for 3-6 months from now.   Merian Capron [facility] 05/05/2015, 0930

## 2015-05-05 NOTE — Tx Team (Signed)
Interdisciplinary Treatment Plan Update (Adult) Date: 05/05/2015    Time Reviewed: 9:30 AM  Progress in Treatment: Attending groups: Continuing to assess, patient new to milieu Participating in groups: Continuing to assess, patient new to milieu Taking medication as prescribed: Yes Tolerating medication: Yes Family/Significant other contact made: No, CSW assessing for appropriate contacts Patient understands diagnosis: Yes Discussing patient identified problems/goals with staff: Yes Medical problems stabilized or resolved: Yes Denies suicidal/homicidal ideation: Yes Issues/concerns per patient self-inventory: Yes Other:  New problem(s) identified: N/A  Discharge Plan or Barriers: CSW continuing to assess, patient new to milieu.  Reason for Continuation of Hospitalization:  Depression Anxiety Medication Stabilization   Comments: N/A  Estimated length of stay: 2-3 days   Patient is a 19 year old Caucasian male with a diagnosis of PTSD and Bipolar I Disorder. Pt reports that he brought himself to the hospital after his "feelings changed" and he started having thoughts about hurting himself and others. Pt lives in a house with roommates and can return there at discharge. He reports that he has gone to [facility] in the past but "didn't go for a while." Pt presents with flat affect and depressed mood with limited insight. Pt requests that CSW contact his employer to inform them that he is in the hospital. Patient will benefit from crisis stabilization, medication evaluation, group therapy and psycho education in addition to case management for discharge planning.   Review of initial/current patient goals per problem list:  1. Goal(s): Patient will participate in aftercare plan   Met: Yes   Target date: 3-5 days post admission date   As evidenced by: Patient will participate within aftercare plan AEB aftercare provider and housing plan at discharge being  identified.   05/05/2015: Goal met: Patient plans to return home to follow up with outpatient services.    2. Goal (s): Patient will exhibit decreased depressive symptoms and suicidal ideations.   Met: No   Target date: 3-5 days post admission date   As evidenced by: Patient will utilize self rating of depression at 3 or below and demonstrate decreased signs of depression or be deemed stable for discharge by MD.   05/05/2015: Goal not met: Pt presents with flat affect and depressed mood.  Pt admitted with depression rating of 10.  Pt to show decreased sign of depression and a rating of 3 or less before d/c.       Attendees: Patient:    Family:    Physician: Dr. Parke Poisson; Dr. Sabra Heck 05/05/2015 9:30 AM  Nursing: Jules Husbands, [address], Loletta Specter, RN 05/05/2015 9:30 AM  Clinical Social Worker: Tilden Fossa,  LCSWA 05/05/2015 9:30 AM  Other: Peri Maris, Heather Smart LCSWA  05/05/2015 9:30 AM   05/05/2015 9:30 AM   05/05/2015 9:30 AM  Other: Agustina Caroli, NP 05/05/2015 9:30 AM  Other:       Scribe for Treatment Team:  Tilden Fossa, MSW, [place] [phone]

## 2015-05-05 NOTE — Progress Notes (Signed)
Pt attended spiritual care group on grief and loss facilitated by chaplain Burnis Kingfisher.  Group opened with brief discussion and psycho-social ed around grief and loss in relationships and in relation to self - identifying life patterns, circumstances, changes that cause losses. Established group norm of speaking from own life experience. Group goal of establishing open and affirming space for members to share loss and experience with grief, normalize grief experience and provide psycho social education and grief support.    [name] was present throughout group.  He was seated in corner of room, exhibited flat affect, and did not voluntarily engage with other group members.   Belva Crome MDiv

## 2015-05-05 NOTE — Plan of Care (Signed)
Problem: Diagnosis: Increased Risk For Suicide Attempt Goal: STG-Patient Will Attend All Groups On The Unit Outcome: Progressing Pt attended and engaged in evening wrap up group     

## 2015-05-05 NOTE — Progress Notes (Signed)
D: Pt presents with flat affect and depressed mood. Pt rates depression 5/10. Hopelessness 5/10. Anxiety 5/10. Pt denies suicidal ideations this morning. Pt denies HI. Pt reported feeling groggy this morning d/t taking Seroquel at bedtime. Pt stated that he had a hard time waking up this morning and had to lay back down. Pt currently taking Seroquel 200 mg at HS. Pt stated that Seroquel may need to be decreased so that it will not interfere with his work. Pt complaint with taking meds and attending groups. A: Medications administered as ordered per MD. Medications reviewed. Verbal support given. Pt encouraged to attend groups. 15 minute checks performed for safety. R: Pt receptive to tx.
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
## 2015-05-05 NOTE — [facility] Group Notes (Signed)
[facility] LCSW Group Therapy  05/05/2015   1:15 PM   Type of Therapy:  Group Therapy  Participation Level:  Active  Participation Quality:  Attentive, Sharing and Supportive  Affect:  Depressed and Flat  Cognitive:  Alert and Oriented  Insight:  Developing/Improving and Engaged  Engagement in Therapy:  Developing/Improving and Engaged  Modes of Intervention:  Clarification, Confrontation, Discussion, Education, Exploration, Limit-setting, Orientation, Problem-solving, Rapport Building, Dance movement psychotherapist, Socialization and Support  Summary of Progress/Problems: The topic for group therapy was feelings about diagnosis.  Pt actively participated in group discussion on their past and current diagnosis and how they feel towards this.  Pt also identified how society and family members judge them, based on their diagnosis as well as stereotypes and stigmas.  Patient discussed having difficulty interacting appropriately with others at his job due to his symptoms and worried about being judged by his coworkers or fired due to his mental illness. CSW and other group members provided patient with emotional support and encouragement.  Samuella Bruin, MSW, [employer] Clinical Social Worker [facility] [phone]

## 2015-05-05 NOTE — Progress Notes (Signed)
D: Patient in dayroom on approach. Pt reports he is feeling claustrophobic and wants to go home. Pt reports plans to continue medication and employment after discharge. Pt reports anxiety as 5 on 0-10 scale. Pt mood and affect appeared anxious. Pt reports he is tolerating medication well. Pt denies SI/HI/AVH and pain. Pt attended and participated in evening wrap up group. Cooperative with assessment.   A: Met with pt 1:1. Medications administered as prescribed. Support and encouragement provided to attend groups and engage in milieu. Pt encouraged to discuss feelings and come to staff with any question or concerns.   R: Patient remains safe and complaint with medications.

## 2015-05-06 LAB — HEMOGLOBIN A1C
HEMOGLOBIN A1C: 5.6 % (ref 4.8–5.6)
MEAN PLASMA GLUCOSE: 114 mg/dL

## 2015-05-06 MED ORDER — DIVALPROEX SODIUM ER 500 MG PO TB24
750.0000 mg | ORAL_TABLET | Freq: Every day | ORAL | Status: DC
  Administered 2015-05-06 – 2015-05-07 (×2): 750 mg via ORAL
  Filled 2015-05-06 (×5): qty 1

## 2015-05-06 MED ORDER — QUETIAPINE FUMARATE 200 MG PO TABS
200.0000 mg | ORAL_TABLET | Freq: Every day | ORAL | Status: DC
  Administered 2015-05-06: 200 mg via ORAL
  Filled 2015-05-06 (×4): qty 1

## 2015-05-06 MED ORDER — DIVALPROEX SODIUM ER 500 MG PO TB24
500.0000 mg | ORAL_TABLET | Freq: Every morning | ORAL | Status: DC
  Administered 2015-05-07 – 2015-05-08 (×2): 500 mg via ORAL
  Filled 2015-05-06 (×3): qty 1
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
## 2015-05-06 NOTE — [facility] Group Notes (Signed)
[facility] LCSW Group Therapy 05/06/2015  1:15 PM   Type of Therapy: Group Therapy  Participation Level: Did Not Attend. Patient invited to participate but declined.   Jahzion Brogden, MSW, LCSWA Clinical Social Worker [facility] [phone]   

## 2015-05-06 NOTE — Progress Notes (Signed)
Patient anxious, agitated this morning. Visibly shaking. Affect constricted. "I just can't believe I'm back here. Why can't I stay out of the hospital?" Rates his depression at a 5/10, hopelessness at an 8/10 and anxiety at a 6/10. States his goal is to "talk about my disability with the doctor and social worker." Patient given emotional support, reassurance. Suggested he not see this as a setback but rather a necessary step to wellness. Medicated with scheduled meds and seroquel prn given as vistaril prior to breakfast was of no benefit. On reassess, patient calmer and reports relief. Denies pain or problems. No SI/HI and remains safe. [name]

## 2015-05-06 NOTE — [facility] Group Notes (Signed)
   [facility] LCSW Aftercare Discharge Planning Group Note  05/06/2015  8:45 AM   Participation Quality: Alert, Appropriate and Oriented  Mood/Affect: Depressed and Flat  Depression Rating: 10  Anxiety Rating: 10  Thoughts of Suicide: Pt denies SI/HI  Will you contract for safety? Yes  Current AVH: Pt denies  Plan for Discharge/Comments: Pt attended discharge planning group and actively participated in group. CSW provided pt with today's workbook. Patient reports high anxiety and depressive symptoms this morning. He plans to return home and follow up at [facility].   Transportation Means: Pt reports access to transportation  Supports: No supports mentioned at this time  Samuella Bruin, MSW, [employer] Clinical Social Worker [facility] [phone]

## 2015-05-06 NOTE — Progress Notes (Signed)
Recreation Therapy Notes  Date: 09.14.2016 Time: 9:30am  Location: 300 Hall Group Room   Group Topic: Stress Management  Goal Area(s) Addresses:  Patient will actively participate in stress management techniques presented during session.   Behavioral Response: Did not attend.    Caramia Boutin L Shaney Deckman, LRT/CTRS  Ustin Cruickshank L 05/06/2015 11:31 AM 
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
## 2015-05-06 NOTE — [facility] Suicide Risk Assessment (Signed)
[facility] INPATIENT:  Family/Significant Other Suicide Prevention Education  Suicide Prevention Education:  Patient provided CSW with permission to speak with his aunt [name] or mother [name] but has no contact information for either. No contact numbers for identified supports in medical record. SPE reviewed with patient.    [name], [place] 05/06/2015, 3:36 PM

## 2015-05-06 NOTE — Progress Notes (Signed)
D: Pt presents with increased anxiety/agitation on the unit. "I feel like I am loosing my mind." "I do not want to eat this nasty food."  A:Special checks q 15 mins in place for safety. PRN Seroquel  PO given for increased agitation. Seclusion room opened per patient request.  R: Pt sitting in unlocked seclusion room. Eager to take PRN medication. Safety maintained. Will continue to monitor.
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
## 2015-05-06 NOTE — Plan of Care (Signed)
Problem: Ineffective individual coping Goal: STG: Patient will remain free from self harm Outcome: Progressing Patient has not engaged in self harm, denies urges, SI  Problem: Diagnosis: Increased Risk For Suicide Attempt Goal: STG-Patient Will Comply With Medication Regime Outcome: Progressing Patient complying with meds. Able to express need for prn's when appropriate.
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
## 2015-05-07 MED ORDER — OLANZAPINE 10 MG PO TBDP
10.0000 mg | ORAL_TABLET | Freq: Every day | ORAL | Status: DC
  Administered 2015-05-07: 10 mg via ORAL
  Filled 2015-05-07 (×3): qty 1

## 2015-05-07 MED ORDER — OLANZAPINE 5 MG PO TBDP
5.0000 mg | ORAL_TABLET | ORAL | Status: AC
  Administered 2015-05-07: 5 mg via ORAL
  Filled 2015-05-07 (×2): qty 1

## 2015-05-07 MED ORDER — OLANZAPINE 5 MG PO TBDP
5.0000 mg | ORAL_TABLET | Freq: Every day | ORAL | Status: DC
  Filled 2015-05-07: qty 1

## 2015-05-07 NOTE — Progress Notes (Signed)
D: Patient in the hallway on approach.  Patient states his goal was to work on his attitude and his mouth.  Patient states he did not meet his goal.   Patient states, "That doctor pissed me off and so did that girl."  When writer asked how patient worked on his attitude today patient states, "Well I cleaned up the dayroom after I got mad a messed it up."  Patient does not appear to have insight and does not appear to be vested in treatment.  Patient states, "The medicine should fix my attitude.  Patient did not go off the unit for group tonight due to being restricted for 24 hours. A: Staff to monitor Q 15 mins for safety.  Encouragement and support offered.  Scheduled medications administered per orders. R: Patient remains safe on the unit.  Patient did not attend group tonight.  Patient visible on the unit and interacting with peers.  Patient taking administered medications.
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
## 2015-05-07 NOTE — Progress Notes (Signed)
Was not able to attend Karaoke wrap up group tonight. Remained on the unit and was in the dayroom majority of the time.

## 2015-05-07 NOTE — Progress Notes (Signed)
DAR Note: Patient calm and pleasant on approach.  Rates depression at 5/10, hopelessness at 8/10, and anxiety at 6/10.  Denies pain, auditory and visual hallucination.  Report goal for today is to work on his attitude.  Patient requested and received vistaril for complain of anxiety.  Patient also complain of agitation after returning from outside activity.  Patient report that another patient told him to watch his language during activity.  Staff reported that patient was verbally inappropriate repeatedly using the "F" words after several redirection.  Patient reminded to use his coping skills learned from therapy.  Medications given as prescribed.  Maintained on routine safety checks per protocol.  Support and encouragement offered as needed.

## 2015-05-07 NOTE — [facility] Group Notes (Signed)
[facility] Group Notes:  (Nursing/MHT/Case Management/Adjunct)  Date:  05/07/2015  Time:  0930 Type of Therapy:  Psychoeducational Skills  Participation Level:  Active  Participation Quality:  Appropriate and Attentive  Affect:  Appropriate  Cognitive:  Alert and Appropriate  Insight:  Appropriate and Good  Engagement in Group:  Engaged  Modes of Intervention:  Activity, Discussion and Education  Summary of Progress/Problems: Patient attended group.  Patient was engaged and supportive.  States his goal is to work on his attitude and stay on his medications.  Patient encouraged to voice his concerns to his provider and support system.  Patient receptive to therapy. Mickie Bail 05/07/2015, 11:26 AM

## 2015-05-07 NOTE — [facility] Group Notes (Signed)
[facility] Group Notes:  (Counselor/Nursing/MHT/Case Management/Adjunct)  05/07/2015 1:15PM  Type of Therapy:  Group Therapy  Participation Level:  Active  Participation Quality:  Appropriate  Affect:  Flat  Cognitive:  Oriented  Insight:  Improving  Engagement in Group:  Limited  Engagement in Therapy:  Limited  Modes of Intervention:  Discussion, Exploration and Socialization  Summary of Progress/Problems: The topic for group was balance in life.  Pt participated in the discussion about when their life was in balance and out of balance and how this feels.  Pt discussed ways to get back in balance and short term goals they can work on to get where they want to be. Based on comments he was making at the beginning of group, Andriy clearly had no investment in being there.  It was not a problem until I asked him specifically about the theme, and his response included a swear word wrapped within an "I don't care message."  I asked him to leave.  He refused. We moved the group and left him there.   Ida Rogue 05/07/2015 2:46 PM

## 2015-05-08 DIAGNOSIS — F3164 Bipolar disorder, current episode mixed, severe, with psychotic features: Secondary | ICD-10-CM | POA: Diagnosis present

## 2015-05-08 DIAGNOSIS — F4310 Post-traumatic stress disorder, unspecified: Secondary | ICD-10-CM

## 2015-05-08 MED ORDER — DIVALPROEX SODIUM ER 250 MG PO TB24
750.0000 mg | ORAL_TABLET | Freq: Every day | ORAL | Status: AC
  Administered 2015-05-08: 750 mg via ORAL
  Filled 2015-05-08: qty 3

## 2015-05-08 MED ORDER — HALOPERIDOL LACTATE 5 MG/ML IJ SOLN
10.0000 mg | Freq: Once | INTRAMUSCULAR | Status: AC
  Administered 2015-05-08: 10 mg via INTRAMUSCULAR
  Filled 2015-05-08: qty 2

## 2015-05-08 MED ORDER — LORAZEPAM 2 MG/ML IJ SOLN
INTRAMUSCULAR | Status: AC
  Administered 2015-05-08: 2 mg via INTRAVENOUS
  Filled 2015-05-08: qty 1

## 2015-05-08 MED ORDER — DIVALPROEX SODIUM ER 250 MG PO TB24
1250.0000 mg | ORAL_TABLET | Freq: Every day | ORAL | Status: DC
  Administered 2015-05-09 – 2015-05-10 (×2): 1250 mg via ORAL
  Filled 2015-05-08 (×4): qty 5

## 2015-05-08 MED ORDER — HALOPERIDOL LACTATE 5 MG/ML IJ SOLN
INTRAMUSCULAR | Status: AC
  Administered 2015-05-08: 10 mg via INTRAMUSCULAR
  Filled 2015-05-08: qty 1

## 2015-05-08 MED ORDER — DIPHENHYDRAMINE HCL 50 MG/ML IJ SOLN
25.0000 mg | Freq: Once | INTRAMUSCULAR | Status: AC
  Administered 2015-05-08: 25 mg via INTRAVENOUS
  Filled 2015-05-08: qty 0.5

## 2015-05-08 MED ORDER — OLANZAPINE 5 MG PO TBDP
15.0000 mg | ORAL_TABLET | Freq: Every day | ORAL | Status: DC
  Administered 2015-05-09 – 2015-05-10 (×2): 15 mg via ORAL
  Filled 2015-05-08 (×5): qty 3

## 2015-05-08 MED ORDER — LORAZEPAM 2 MG/ML IJ SOLN
2.0000 mg | Freq: Once | INTRAMUSCULAR | Status: AC
  Administered 2015-05-08: 2 mg via INTRAVENOUS

## 2015-05-08 MED ORDER — DIPHENHYDRAMINE HCL 50 MG/ML IJ SOLN
INTRAMUSCULAR | Status: AC
  Administered 2015-05-08: 25 mg via INTRAVENOUS
  Filled 2015-05-08: qty 1

## 2015-05-08 NOTE — Progress Notes (Signed)
DAR Note: Patient stayed in his room most of the shift.  Complain of anxiety at 6/10.  Patient requested and received Vistaril x 2.  Patient reminded and encouraged to use his coping skills as well.  Maintained on routine safety checks per protocol.  Medication given as prescribed.  Support and encouragement offered as needed.

## 2015-05-08 NOTE — Progress Notes (Signed)
Did not attend group 

## 2015-05-08 NOTE — Tx Team (Signed)
Interdisciplinary Treatment Plan Update (Adult) Date: 05/08/2015    Time Reviewed: 9:30 AM  Progress in Treatment: Attending groups: Yes, irritable and intrusive Participating in groups: Yes, see above, had to be asked to leave group due to inappropriate behavior Taking medication as prescribed: Yes Tolerating medication: Yes Family/Significant other contact made: No, pt unable to provide contact information Patient understands diagnosis: Yes Discussing patient identified problems/goals with staff: Yes Medical problems stabilized or resolved: Yes Denies suicidal/homicidal ideation: Yes Issues/concerns per patient self-inventory: Yes Other:  New problem(s) identified: N/A  Discharge Plan or Barriers: None  Reason for Continuation of Hospitalization:  Depression Anxiety Medication Stabilization   Comments: N/A  Estimated length of stay: 2-3 days   Patient is a 19 year old Caucasian male with a diagnosis of PTSD and Bipolar I Disorder. Pt reports that he brought himself to the hospital after his "feelings changed" and he started having thoughts about hurting himself and others. Pt lives in a house with roommates and can return there at discharge. He reports that he has gone to [facility] in the past but "didn't go for a while." Pt presents with flat affect and depressed mood with limited insight. Pt requests that CSW contact his employer to inform them that he is in the hospital. Patient will benefit from crisis stabilization, medication evaluation, group therapy and psycho education in addition to case management for discharge planning.   9/16:  Patient restricted to unit due to inappropriate behavior, became irate in group due to dislike of topic under discussion, threw sugar packets/creamer packets/puzzle pieces; using profanity, conflicts w other patients, MD continuing to adjust medications and staff providing additional support.    Review of initial/current patient goals per  problem list:  1. Goal(s): Patient will participate in aftercare plan   Met: Yes   Target date: 3-5 days post admission date   As evidenced by: Patient will participate within aftercare plan AEB aftercare provider and housing plan at discharge being identified.   05/08/2015: Goal met: Patient plans to return home to follow up with outpatient services.    2. Goal (s): Patient will exhibit decreased depressive symptoms and suicidal ideations.   Met: No   Target date: 3-5 days post admission date   As evidenced by: Patient will utilize self rating of depression at 3 or below and demonstrate decreased signs of depression or be deemed stable for discharge by MD.   05/08/2015: Goal not met: Pt presents with flat affect and depressed mood. Irritable and acting out, unable to display appropriate behavior, easily angered.  Pt admitted with depression rating of 10.  Pt to show decreased sign of depression and a rating of 3 or less before d/c.         Attendees: Patient:    Family:    Physician: Dr. Shea Evans 05/05/2015 9:30 AM  Nursing: , Val Riles RN 05/05/2015 9:30 AM  Clinical Social Worker: Edwyna Shell,  [employer] 05/05/2015 9:30 AM  Other: Lavena Stanford, [facility]; Lars Pinks RN UR, Norberto Sorenson, [place] 05/05/2015 9:30 AM   05/05/2015 9:30 AM   05/05/2015 9:30 AM  Other:  05/05/2015 9:30 AM  Other:       Scribe for Treatment Team:  Tilden Fossa, MSW, [employer] [phone]

## 2015-05-08 NOTE — [facility] Group Notes (Addendum)
[facility] LCSW Group Therapy   05/08/2015 1:45 PM  Type of Therapy: Group Therapy  Participation Level:  Active  Participation Quality:  Attentive  Affect:  Flat  Cognitive:  Oriented  Insight:  Limited  Engagement in Therapy:  Engaged  Modes of Intervention:  Discussion and Socialization  Summary of Progress/Problems: Chaplain was here to lead a group on themes of hope and/or courage.  Pt was alert and pleasant throughout the group. Pt spoke about missing the relationship he used to have with two of his friends and wishing that he could be friends with those people again. Pt also disclosed that he recently got out of a relationship and he is struggling with that. "I don't even have energy to cry anymore".  [name] 05/08/2015 1:45 PM
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
## 2015-05-08 NOTE — Progress Notes (Signed)
D: Patient calm and cooperative while speaking to Clinical research associate at this time.  Patient states he had a good day until he was told he did not have a medication for agitation as needed.  Patient states, "I just got mad and angry."  Patient did state he felt bad about everything that happened referring to the CIRT.  Patient states he will come talk to staff if he states becoming angry.  Patient denies SI/HI and denies AVH.   A: Staff to monitor Q 15 mins for safety.  Encouragement and support offered.  Scheduled medications administered per orders.   R: Patient remains safe on the unit.  Patient did not attend group tonight.  Patient visible on the unit tonight.  Patient taking administered medications.

## 2015-05-08 NOTE — Progress Notes (Signed)
[name] reports that he is feeling more depressed.  He feels that it is because the people he was able to communicate with have left the unit.  "I feel alone."  He denies SI.  Encouraged him to try something to get his mind off and focus.  Suggested for him to try word searches.  He is currently in the day room working on word search.  He appears to be in no physical distress.  We will continue to encourage him to find some activity to keep him occupied and also instructed him to make sure he tells the doctor tomorrow about his depression.

## 2015-05-08 NOTE — [facility] Group Notes (Signed)
[facility] LCSW Aftercare Discharge Planning Group Note   05/08/2015 10:44 AM  Participation Quality:  Active   Mood/Affect:  Appropriate  Depression Rating:  7/10   Anxiety Rating:  5/10  Thoughts of Suicide:  No Will you contract for safety?   NA  Current AVH:  No  Plan for Discharge/Comments:  Pt was pleasant and appropriate in group. Pt's plan for discharge is still unknown.  Transportation Means:   Supports: Mental health providers   Jonathon Jordan

## 2015-05-09 DIAGNOSIS — F1210 Cannabis abuse, uncomplicated: Secondary | ICD-10-CM

## 2015-05-09 MED ORDER — QUETIAPINE FUMARATE 25 MG PO TABS
25.0000 mg | ORAL_TABLET | Freq: Four times a day (QID) | ORAL | Status: DC | PRN
  Administered 2015-05-09 – 2015-05-10 (×2): 25 mg via ORAL
  Filled 2015-05-09 (×2): qty 1
  Filled 2015-05-09: qty 6

## 2015-05-09 MED ORDER — QUETIAPINE FUMARATE 100 MG PO TABS
ORAL_TABLET | ORAL | Status: AC
  Filled 2015-05-09: qty 1

## 2015-05-09 MED ORDER — HALOPERIDOL LACTATE 5 MG/ML IJ SOLN
5.0000 mg | Freq: Once | INTRAMUSCULAR | Status: AC
  Administered 2015-05-09: 5 mg via INTRAMUSCULAR
  Filled 2015-05-09 (×2): qty 1

## 2015-05-09 MED ORDER — QUETIAPINE FUMARATE 100 MG PO TABS
100.0000 mg | ORAL_TABLET | Freq: Once | ORAL | Status: AC
  Administered 2015-05-09: 100 mg via ORAL

## 2015-05-09 NOTE — Progress Notes (Signed)
DAR Note: Patient calm on approach.  Was apologetic for his behavior the previous day.  Denies pain, auditory and visual hallucinations.  Rates depression and hopelessness at 7/10, anxiety at 6/10.  Reports goal for today is "to work on his attitude."  Requested and received Vistaril and Seroquel for anxiety and agitation.  Maintained on routine safety checks per protocol.   Patient reminded to continue to use his coping skills.  Support and encouragement offered as needed.
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
## 2015-05-09 NOTE — B[facility] Group Notes (Signed)
B[facility] Group Notes: (Clinical Social Work)   05/09/2015      Type of Therapy:  Group Therapy   Participation Level:  Did Not Attend despite MHT prompting   Mareida Grossman-Orr, LCSW 05/09/2015, 1:02 PM     

## 2015-05-09 NOTE — Progress Notes (Signed)
[facility] Group Notes:  (Nursing/MHT/Case Management/Adjunct)  Date:  05/09/2015  Time:  8:54 PM  Type of Therapy:  Psychoeducational Skills  Participation Level:  Minimal  Participation Quality:  Resistant  Affect:  Depressed, Flat and Irritable  Cognitive:  Lacking  Insight:  Limited  Engagement in Group:  Poor and Resistant  Modes of Intervention:  Education  Summary of Progress/Problems: The patient had little to share with the group this evening except to say that he felt "depressed" which makes him feel angry. In terms of the theme for the day, his coping skill is to listen to music.  Hazle Coca S 05/09/2015, 8:54 PM

## 2015-05-10 LAB — VALPROIC ACID LEVEL: Valproic Acid Lvl: 87 ug/mL (ref 50.0–100.0)

## 2015-05-10 NOTE — BHH Group Notes (Signed)
[facility] Group Notes:  (Clinical Social Work)  05/10/2015  B[facility] Group Notes:  (Clinical Social Work)  05/10/2015  11:00AM-12:00PM  Summary of Progress/Problems:  The main focus of today's process group was to listen to a variety of genres of music and to identify that different types of music provoke different responses.  The patient then was able to identify personally what was soothing for them, as well as energizing.  Handouts were used to record feelings evoked, as well as how patient can personally use this knowledge in sleep habits, with depression, and with other symptoms.  The patient expressed understanding of concepts, as well as knowledge of how each type of music affected him and how this can be used at home as a wellness/recovery tool.  He was fully engaged.  Type of Therapy:  Music Therapy   Participation Level:  Active  Participation Quality:  Attentive and Sharing  Affect:  Blunted at times, Smiling at times  Cognitive:  Oriented  Insight:  Engaged  Engagement in Therapy:  Engaged  Modes of Intervention:   Activity, Exploration  Tristan Mantle, LCSW 05/10/2015

## 2015-05-10 NOTE — BHH Group Notes (Signed)
[facility] Group Notes:  (Nursing/MHT/Case Management/Adjunct)  Date:  05/10/2015  Time: 1000 Type of Therapy:  Psychoeducational Skills  Participation Level:  Minimal  Participation Quality:  Drowsy and Inattentive  Affect:  Blunted and Flat  Cognitive:  Oriented  Insight:  Limited  Engagement in Group:  Limited  Modes of Intervention:  Discussion and Education  Summary of Progress/Problems: Pt present but not engaged. Pt had very minimal input despite prompting.  Harvel Quale 05/10/2015, 1:31 PM

## 2015-05-10 NOTE — Progress Notes (Signed)
[facility] Group Notes:  (Nursing/MHT/Case Management/Adjunct)  Date:  05/10/2015  Time:  11:42 PM  Type of Therapy:  Psychoeducational Skills  Participation Level:  Minimal  Participation Quality:  Resistant  Affect:  Flat and Irritable  Cognitive:  Lacking  Insight:  None  Engagement in Group:  Resistant  Modes of Intervention:  Education  Summary of Progress/Problems: The patient stated that he had a "good" day but refused to share any additional details. He states that he will be his own support system (theme of the day).   Hazle Coca S 05/10/2015, 11:42 PM

## 2015-05-10 NOTE — Progress Notes (Signed)
D: Pt who got an IM injection for disrupting the unit last night is treating to disrupt the peaceful milieu again today. He states, "I need a shot right now, I don't know why but I feel something is not right." Pt can be observed pacing the unit hallway. Pt also verbalizes moderate depression and anxiety. He states, "My life is a mess; I just came out of a three year relationship, my job sucks." Pt however denies SI, pain and AVH.   A: Medications offered as prescribed.  Support, encouragement, and safe environment provided.  15-minute safety checks continue.  R: Pt was med compliant.  Pt attend group. Safety checks continue.

## 2015-05-10 NOTE — Progress Notes (Cosign Needed)
D) Pt has been blunted, anxious, sullen at times. Pt is positive for unit activities with prompting. Gary c/o anxiety throughout this shift. Pt c/o fine tremors but writer did not observe this. Pt states maybe he needs "a different" medication for anxiety. Pt says he is missing his relationship but knows "it's best" to break up. A) level 3 obs for safety, support and encouragement provided. Med ed reinforced. Positive coping skills reinforced. R) Safety maintained. Cooperative.
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
## 2015-05-11 DIAGNOSIS — F3160 Bipolar disorder, current episode mixed, unspecified: Secondary | ICD-10-CM | POA: Insufficient documentation

## 2015-05-11 MED ORDER — OLANZAPINE 15 MG PO TBDP: 15.0000 mg | ORAL_TABLET | Freq: Every day | ORAL | Status: DC

## 2015-05-11 MED ORDER — DIVALPROEX SODIUM ER 250 MG PO TB24: 1250.0000 mg | ORAL_TABLET | Freq: Every day | ORAL | Status: DC

## 2015-05-11 MED ORDER — QUETIAPINE FUMARATE 25 MG PO TABS: 25.0000 mg | ORAL_TABLET | Freq: Four times a day (QID) | ORAL | Status: DC | PRN

## 2015-05-11 MED ORDER — ACETAMINOPHEN ER 650 MG PO TBCR: 650.0000 mg | EXTENDED_RELEASE_TABLET | Freq: Three times a day (TID) | ORAL | Status: DC | PRN

## 2015-05-11 MED ORDER — GABAPENTIN 300 MG PO CAPS: 300.0000 mg | ORAL_CAPSULE | Freq: Three times a day (TID) | ORAL | Status: DC

## 2015-05-11 MED ORDER — CITALOPRAM HYDROBROMIDE 20 MG PO TABS: 20.0000 mg | ORAL_TABLET | Freq: Every day | ORAL | Status: DC

## 2015-05-11 MED ORDER — HYDROXYZINE HCL 25 MG PO TABS: 25.0000 mg | ORAL_TABLET | Freq: Three times a day (TID) | ORAL | Status: DC | PRN

## 2015-05-11 MED ORDER — TRAZODONE HCL 50 MG PO TABS: 50.0000 mg | ORAL_TABLET | Freq: Every day | ORAL | Status: DC

## 2015-05-11 NOTE — Progress Notes (Signed)
  [facility] Adult Case Management Discharge Plan :  Will you be returning to the same living situation after discharge:  Yes,  home At discharge, do you have transportation home?: Yes,  vehicle is here Do you have the ability to pay for your medications: Yes,  mental health  Release of information consent forms completed and in the chart;  Patient's signature needed at discharge.  Patient to Follow up at: Follow-up Information    Follow up with [facility].   Specialty:  Professional Counselor   Why:  Walk-in clinic Monday-Friday between 8am to 12pm for assessment for therapy and medication management services.    Contact information:   [facility] [address] [place] [address] [phone]       Patient denies SI/HI: Yes,  yes    Safety Planning and Suicide Prevention discussed: Yes,  yes  Have you used any form of tobacco in the last 30 days? (Cigarettes, Smokeless Tobacco, Cigars, and/or Pipes): Yes  Has patient been referred to the Quitline?: Patient refused referral  Ida Rogue 05/11/2015, 11:40 AM

## 2015-05-11 NOTE — Progress Notes (Signed)
D: Pt informed the writer that he'd had a "good day." Stated he hopes to be discharged tomorrow. Pt plans to follow up with [facility], stating that he doesn't want to go back to [place].  Pt had no other questions or concerns.  A:  Support and encouragement was offered. 15 min checks continued for safety.  R: Pt remains safe.

## 2015-05-11 NOTE — [facility] Suicide Risk Assessment (Signed)
[facility] Discharge Suicide Risk Assessment   Demographic Factors:   19 year old single male, employed, lives with roommates   Total Time spent with patient: 30 minutes  Musculoskeletal: Strength & Muscle Tone: within normal limits Gait & Station: unsteady Patient leans: N/A  Psychiatric Specialty Exam: Physical Exam  ROS  Blood pressure 133/87, pulse 96, temperature 97.8 F (36.6 C), temperature source Oral, resp. rate 16, height 5' 8.5" (1.74 m), weight 136 lb (61.689 kg), SpO2 99 %.Body mass index is 20.38 kg/(m^2).  General Appearance: improved grooming   Eye Contact::  Good  Speech:  Normal Rate409  Volume:  Normal  Mood:  improved, states that today he feels it is " normal" denies depression   Affect:  Appropriate- at present calm, reactive affect   Thought Process:  Linear  Orientation:  Full (Time, Place, and Person)  Thought Content:  denies hallucinations, no delusions, not internally preoccupied   Suicidal Thoughts:  No at this time denies any thoughts of hurting self, denies suicidal ideations  Homicidal Thoughts:  No  Memory:  recent and remote grossy intact   Judgement:  Fair  Insight:  Fair  Psychomotor Activity:  Normal- no current psychomotor agitation   Concentration:  Good  Recall:  Good  Fund of Knowledge:Good  Language: Good  Akathisia:  Negative  Handed:  Right  AIMS (if indicated):     Assets:  Communication Skills Desire for Improvement Housing Physical Health Vocational/Educational  Sleep:  Number of Hours: 6.75  Cognition: WNL  ADL's: improved     Have you used any form of tobacco in the last 30 days? (Cigarettes, Smokeless Tobacco, Cigars, and/or Pipes): Yes  Has this patient used any form of tobacco in the last 30 days? (Cigarettes, Smokeless Tobacco, Cigars, and/or Pipes) Yes, A prescription for an FDA-approved tobacco cessation medication was offered at discharge and the patient refused  Mental Status Per Nursing Assessment::   On  Admission:  Suicidal ideation indicated by patient, Thoughts of violence towards others, Plan to harm others  Current Mental Status by Physician: At this time patient is improved compared to his admission. He presents calm, well related, states his mood is better, and denies any SI or HI, no psychotic symptoms, and is future oriented, plans to return home- lives with roommates - and plans to return to work later this week .   Loss Factors:  recent broken/ terminated internet relationship   Historical Factors: History of depression, history of bipolar disorder diagnosis in the past, history of borderline personality disorder features, several prior psychiatric admissions   Risk Reduction Factors:   Sense of responsibility to family, Living with another person, especially a relative and Positive coping skills or problem solving skills  Continued Clinical Symptoms:  As noted, patient improved , and at this time not suicidal , not homicidal , not psychotic, future oriented .  Cognitive Features That Contribute To Risk:  No gross cognitive deficits noted upon discharge. Is alert , attentive, and oriented x 3   Suicide Risk:  Mild:  Suicidal ideation of limited frequency, intensity, duration, and specificity.  There are no identifiable plans, no associated intent, mild dysphoria and related symptoms, good self-control (both objective and subjective assessment), few other risk factors, and identifiable protective factors, including available and accessible social support.  Principal Problem: Bipolar disorder, current episode mixed, severe, with psychotic features Discharge Diagnoses:  Patient Active Problem List   Diagnosis Date Noted  . Suicidal ideation [R45.851] 09/15/2014  . Tachycardia [  R00.0]   . Ingestion of substance [T57.91XA] 08/31/2014  . Overdose of antipsychotic [T43.501A] 08/31/2014  . Allergic rhinitis [477] 06/02/2011  . Urolithiasis [N20.9] 06/02/2011    Follow-up  Information    Follow up with [facility].   Specialty:  Professional Counselor   Why:  Walk-in clinic Monday-Friday between 8am to 12pm for assessment for therapy and medication management services.    Contact information:   [facility] [address] [place] [address] [phone]       Plan Of Care/Follow-up recommendations:  Activity:  as tolerated  Diet:  Regular  Tests:  NA Other:  See below  Is patient on multiple antipsychotic therapies at discharge:  No   Has Patient had three or more failed trials of antipsychotic monotherapy by history:  No  Recommended Plan for Multiple Antipsychotic Therapies: NA   Patient is requesting discharge and there are no current symptoms to justify involuntary commitment . Plans to return home- lives with roommates, and to return to work soon. Follow up as above .     COBOS, FERNANDO 05/11/2015, 1:04 PM

## 2015-05-11 NOTE — Progress Notes (Signed)
D/C note Pt. D/C from the unit to lobby.  He was pleasant and cooperative. He voiced no SI/HI or A/V hallucinations.  Pt. Denies any pain or discomfort.  D/C instructions and medications reviewed with pt.  Pt. verbalized understanding of medications and d/c instructions.   All belongings (from locker 28, wallet with debit cards, helmet, keys, back pack and [place]-[employer]) returned to pt. Q 15 min checks maintained until discharge.  Pt. Left the unit in no apparent distress.
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
## 2015-05-14 ENCOUNTER — Encounter (HOSPITAL_COMMUNITY): Payer: Self-pay | Admitting: Emergency Medicine

## 2015-05-14 ENCOUNTER — Emergency Department (HOSPITAL_COMMUNITY)
Admission: EM | Admit: 2015-05-14 | Discharge: 2015-05-30 | Payer: Federal, State, Local not specified - Other | Attending: Emergency Medicine | Admitting: Emergency Medicine

## 2015-05-14 ENCOUNTER — Other Ambulatory Visit: Payer: Self-pay

## 2015-05-14 ENCOUNTER — Ambulatory Visit (HOSPITAL_COMMUNITY)
Admission: EM | Admit: 2015-05-14 | Discharge: 2015-05-14 | Disposition: A | Discharge status: Home / Self Care | Payer: Federal, State, Local not specified - Other | Source: Intra-hospital | Attending: Psychiatry | Admitting: Psychiatry

## 2015-05-14 DIAGNOSIS — R45851 Suicidal ideations: Secondary | ICD-10-CM

## 2015-05-14 DIAGNOSIS — Z8709 Personal history of other diseases of the respiratory system: Secondary | ICD-10-CM | POA: Insufficient documentation

## 2015-05-14 DIAGNOSIS — F4310 Post-traumatic stress disorder, unspecified: Secondary | ICD-10-CM | POA: Insufficient documentation

## 2015-05-14 DIAGNOSIS — F1210 Cannabis abuse, uncomplicated: Secondary | ICD-10-CM | POA: Insufficient documentation

## 2015-05-14 DIAGNOSIS — F418 Other specified anxiety disorders: Secondary | ICD-10-CM | POA: Insufficient documentation

## 2015-05-14 DIAGNOSIS — F3160 Bipolar disorder, current episode mixed, unspecified: Secondary | ICD-10-CM | POA: Diagnosis not present

## 2015-05-14 DIAGNOSIS — F319 Bipolar disorder, unspecified: Secondary | ICD-10-CM | POA: Insufficient documentation

## 2015-05-14 DIAGNOSIS — Z79899 Other long term (current) drug therapy: Secondary | ICD-10-CM | POA: Insufficient documentation

## 2015-05-14 DIAGNOSIS — F329 Major depressive disorder, single episode, unspecified: Secondary | ICD-10-CM | POA: Insufficient documentation

## 2015-05-14 DIAGNOSIS — F419 Anxiety disorder, unspecified: Secondary | ICD-10-CM | POA: Insufficient documentation

## 2015-05-14 DIAGNOSIS — F603 Borderline personality disorder: Secondary | ICD-10-CM | POA: Insufficient documentation

## 2015-05-14 DIAGNOSIS — Z87442 Personal history of urinary calculi: Secondary | ICD-10-CM | POA: Insufficient documentation

## 2015-05-14 HISTORY — DX: Borderline personality disorder: F60.3

## 2015-05-14 HISTORY — DX: Post-traumatic stress disorder, unspecified: F43.10

## 2015-05-14 LAB — COMPREHENSIVE METABOLIC PANEL
ALBUMIN: 4.5 g/dL (ref 3.5–5.0)
ALT: 18 U/L (ref 17–63)
ANION GAP: 9 (ref 5–15)
AST: 25 U/L (ref 15–41)
Alkaline Phosphatase: 84 U/L (ref 38–126)
BILIRUBIN TOTAL: 0.5 mg/dL (ref 0.3–1.2)
BUN: 14 mg/dL (ref 6–20)
CO2: 27 mmol/L (ref 22–32)
Calcium: 9.5 mg/dL (ref 8.9–10.3)
Chloride: 104 mmol/L (ref 101–111)
Creatinine, Ser: 0.74 mg/dL (ref 0.61–1.24)
GFR calc Af Amer: >60 mL/min (ref >60)
GFR calc non Af Amer: >60 mL/min (ref >60)
GLUCOSE: 80 mg/dL (ref 65–99)
POTASSIUM: 4.1 mmol/L (ref 3.5–5.1)
Sodium: 140 mmol/L (ref 135–145)
TOTAL PROTEIN: 7.5 g/dL (ref 6.5–8.1)

## 2015-05-14 LAB — ACETAMINOPHEN LEVEL: Acetaminophen (Tylenol), Serum: <10 ug/mL — ABNORMAL LOW (ref 10–30)

## 2015-05-14 LAB — RAPID URINE DRUG SCREEN, HOSP PERFORMED
AMPHETAMINES: NOT DETECTED
BENZODIAZEPINES: NOT DETECTED
Barbiturates: NOT DETECTED
COCAINE: NOT DETECTED
OPIATES: NOT DETECTED
TETRAHYDROCANNABINOL: POSITIVE — AB

## 2015-05-14 LAB — CBC
HCT: 43.9 % (ref 39.0–52.0)
HEMOGLOBIN: 14.7 g/dL (ref 13.0–17.0)
MCH: 31.2 pg (ref 26.0–34.0)
MCHC: 33.5 g/dL (ref 30.0–36.0)
MCV: 93.2 fL (ref 78.0–100.0)
Platelets: 219 10*3/uL (ref 150–400)
RBC: 4.71 MIL/uL (ref 4.22–5.81)
RDW: 13.7 % (ref 11.5–15.5)
WBC: 7.3 10*3/uL (ref 4.0–10.5)

## 2015-05-14 LAB — ETHANOL: Alcohol, Ethyl (B): <5 mg/dL (ref <5)

## 2015-05-14 LAB — SALICYLATE LEVEL

## 2015-05-14 LAB — VALPROIC ACID LEVEL: Valproic Acid Lvl: <10 ug/mL — ABNORMAL LOW (ref 50.0–100.0)

## 2015-05-14 MED ORDER — TRAZODONE HCL 50 MG PO TABS: 50.0000 mg | ORAL_TABLET | Freq: Every day | ORAL | Status: DC

## 2015-05-14 MED ORDER — HYDROXYZINE HCL 25 MG PO TABS
25.0000 mg | ORAL_TABLET | Freq: Two times a day (BID) | ORAL | Status: DC
  Administered 2015-05-14 – 2015-05-16 (×4): 25 mg via ORAL
  Filled 2015-05-14 (×4): qty 1

## 2015-05-14 MED ORDER — ONDANSETRON HCL 4 MG PO TABS: 4.0000 mg | ORAL_TABLET | Freq: Three times a day (TID) | ORAL | Status: DC | PRN

## 2015-05-14 MED ORDER — LORAZEPAM 1 MG PO TABS
1.0000 mg | ORAL_TABLET | Freq: Three times a day (TID) | ORAL | Status: DC | PRN
  Administered 2015-05-14: 1 mg via ORAL
  Filled 2015-05-14: qty 1

## 2015-05-14 MED ORDER — HYDROXYZINE HCL 25 MG PO TABS: 25.0000 mg | ORAL_TABLET | Freq: Three times a day (TID) | ORAL | Status: DC | PRN

## 2015-05-14 MED ORDER — GABAPENTIN 300 MG PO CAPS
300.0000 mg | ORAL_CAPSULE | Freq: Three times a day (TID) | ORAL | Status: DC
  Administered 2015-05-14: 300 mg via ORAL
  Filled 2015-05-14: qty 1

## 2015-05-14 MED ORDER — QUETIAPINE FUMARATE 25 MG PO TABS: 25.0000 mg | ORAL_TABLET | Freq: Four times a day (QID) | ORAL | Status: DC | PRN

## 2015-05-14 MED ORDER — TRAZODONE HCL 100 MG PO TABS
100.0000 mg | ORAL_TABLET | Freq: Every day | ORAL | Status: DC
  Administered 2015-05-14 – 2015-05-29 (×15): 100 mg via ORAL
  Filled 2015-05-14 (×17): qty 1

## 2015-05-14 MED ORDER — GABAPENTIN 100 MG PO CAPS
100.0000 mg | ORAL_CAPSULE | Freq: Three times a day (TID) | ORAL | Status: DC
  Administered 2015-05-14 – 2015-05-15 (×5): 100 mg via ORAL
  Filled 2015-05-14 (×5): qty 1

## 2015-05-14 MED ORDER — DIVALPROEX SODIUM ER 500 MG PO TB24
1000.0000 mg | ORAL_TABLET | Freq: Every day | ORAL | Status: DC
  Administered 2015-05-14 – 2015-05-29 (×15): 1000 mg via ORAL
  Filled 2015-05-14 (×18): qty 2

## 2015-05-14 MED ORDER — ZOLPIDEM TARTRATE 5 MG PO TABS: 5.0000 mg | ORAL_TABLET | Freq: Every evening | ORAL | Status: DC | PRN

## 2015-05-14 MED ORDER — CITALOPRAM HYDROBROMIDE 20 MG PO TABS
20.0000 mg | ORAL_TABLET | Freq: Every day | ORAL | Status: DC
  Administered 2015-05-14 – 2015-05-30 (×17): 20 mg via ORAL
  Filled 2015-05-14 (×17): qty 1

## 2015-05-14 MED ORDER — HYDROXYZINE HCL 25 MG PO TABS
50.0000 mg | ORAL_TABLET | Freq: Once | ORAL | Status: AC
  Administered 2015-05-14: 50 mg via ORAL
  Filled 2015-05-14: qty 2

## 2015-05-14 MED ORDER — ACETAMINOPHEN 325 MG PO TABS: 650.0000 mg | ORAL_TABLET | ORAL | Status: DC | PRN

## 2015-05-14 MED ORDER — DIVALPROEX SODIUM ER 500 MG PO TB24
1250.0000 mg | ORAL_TABLET | Freq: Every day | ORAL | Status: DC
  Filled 2015-05-14: qty 1

## 2015-05-14 MED ORDER — OLANZAPINE 5 MG PO TBDP: 15.0000 mg | ORAL_TABLET | Freq: Every day | ORAL | Status: DC

## 2015-05-14 NOTE — ED Notes (Signed)
Pt reports that he allergic to the peeling on the apples only and that he can eat apple sauce, and drinks apple juice w/o difficulty.

## 2015-05-14 NOTE — ED Notes (Signed)
Report called transport in approx 1 hr

## 2015-05-14 NOTE — ED Notes (Signed)
Patient alert and oriented. Observed lying on stretcher watching television. Patient communicated to staff that he was still having thoughts of harming himself and hearing voices. States voices are vague and unclear to what they are saying. Patient agrees he is able to contract for safety while in hospital and that is why he came. Q 15 minute checks in progress monitoring for safety continues.

## 2015-05-14 NOTE — ED Notes (Signed)
After further review,  Pt has been declined at [facility], tts into talk w/ pt.

## 2015-05-14 NOTE — ED Notes (Signed)
Up tot he bathroom to shower and change scrubs 
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
## 2015-05-14 NOTE — ED Notes (Signed)
Pt ambulatory from triage to room 35

## 2015-05-14 NOTE — ED Notes (Signed)
TTS into see 
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
## 2015-05-14 NOTE — ED Notes (Signed)
Bed: WBH35 Expected date:  Expected time:  Means of arrival:  Comments: Hold for triage 3 

## 2015-05-14 NOTE — ED Notes (Signed)
On the phone 

## 2015-05-14 NOTE — BH Assessment (Signed)
Writer informed TTS Charm Barges of the consult.

## 2015-05-14 NOTE — Progress Notes (Signed)
Pt listed as uninsured [place] Cm spoke with pt to confirm he does not have a pcp   CM discussed and provided written information for [employer] pcps, discussed the importance of pcp vs EDP services for f/u care, www.needymeds.org, www.goodrx.com, discounted pharmacies and other [employer] such as [facility] , [employer], affordable care act, financial assistance, uninsured dental services, [place] med assist, DSS and  health department  Reviewed resources for [employer] uninsured accepting pcps like [facility]ussmaul, family medicine at [facility], [facility], [facility], local urgent care centers, [facility], [facility] family practice, general medical clinics, family services of the [place], [facility] urgent care plus others, medication resources, [facility] out patient pharmacies and housing Pt voiced understanding and appreciation of resources provided   Provided [facility] contact information Pt agreed to a referral Cm completed referral Pt to be contact by [facility] clinical liason

## 2015-05-14 NOTE — ED Notes (Signed)
Up to the bathroom 

## 2015-05-14 NOTE — ED Notes (Signed)
Dr a and josephone np into see

## 2015-05-14 NOTE — ED Notes (Signed)
Pt states he was at [facility] last week and was discharged home  Pt states they asked him if felt ready to go and he told them he was but he states he is not  Pt states he is having the same feelings of anxiety, wanting to hurt others, and hurt himself  Pt states his plan would be to take too much medication  Pt states he had an overdose at the beginning of the year and was in ICU  When asked if pt was taking his medication he responded by saying he cannot keep up with all of it
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
## 2015-05-14 NOTE — Progress Notes (Signed)
CSW referred the patient to the following hospitals in order to try and obtain inpatient placement:  [facility] [address]  [place], [place] [phone] ED CSW 05/14/2015 7:49 PM

## 2015-05-15 MED ORDER — ZIPRASIDONE MESYLATE 20 MG IM SOLR
20.0000 mg | Freq: Once | INTRAMUSCULAR | Status: AC
  Administered 2015-05-15: 20 mg via INTRAMUSCULAR

## 2015-05-15 MED ORDER — LORAZEPAM 1 MG PO TABS
1.0000 mg | ORAL_TABLET | Freq: Once | ORAL | Status: AC
  Administered 2015-05-15: 1 mg via ORAL
  Filled 2015-05-15: qty 1

## 2015-05-15 MED ORDER — ZIPRASIDONE MESYLATE 20 MG IM SOLR
INTRAMUSCULAR | Status: AC
  Filled 2015-05-15: qty 20

## 2015-05-15 MED ORDER — HYDROXYZINE HCL 25 MG PO TABS
50.0000 mg | ORAL_TABLET | Freq: Four times a day (QID) | ORAL | Status: DC | PRN
Start: 2015-05-15 — End: 2015-05-30
  Administered 2015-05-15 – 2015-05-29 (×20): 50 mg via ORAL
  Filled 2015-05-15 (×22): qty 2
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
## 2015-05-15 NOTE — ED Notes (Addendum)
Pt. Is very agitated and states he feels like hitting things.  He also states he doesn't want to live any more.   There are new scratches up his wrist that he admitted he did with his comb.   I took comb away and tried to reassure patient.

## 2015-05-15 NOTE — ED Provider Notes (Signed)
  Physical Exam  BP 133/76 mmHg  Pulse 79  Temp(Src) 98.2 F (36.8 C) (Oral)  Resp 16  SpO2 98%  Physical Exam  ED Course  Procedures  MDM Patient became belligerent and combative and required security. Will give IM Geodon      Benjiman Core, MD 05/15/15 [phone]

## 2015-05-15 NOTE — ED Notes (Signed)
Pt. Noted sleeping in room. No complaints or concerns voiced. No distress or abnormal behavior noted. Will continue to monitor with security cameras. Q 15 minute rounds continue. 

## 2015-05-15 NOTE — BHH Counselor (Signed)
Referral packet submitted on 05/15/15 at 12:02 a.m. to [facility], [place], [place], [place], [place]. [place] and [place].  Henri Guedes K. Geoffry Bannister, MS, NCC, LCAS-A  Counselor 05/15/2015 12:38 AM
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
## 2015-05-15 NOTE — ED Notes (Signed)
Pt continues to complain of extreme anxiety even after prn vistaril.  He is restless and pacing hallway.  He was offered magazines to distract him , he was reassured of his safety.  Video monitoring and 15 minute checks in place.
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
## 2015-05-15 NOTE — BH Assessment (Signed)
BHH Assessment Progress Note   Pt seen for reassessment this day.  Pt denies SI currently, stating he could have HI "at any moment.  It changes.  Pt denies AVH.  Pt pending several inpatient facilities.  Kristen ButCasimer [facility] Therapeutic Triage Specialist [facility]

## 2015-05-15 NOTE — ED Notes (Signed)
Pt. Agitated, throwing over bed table and chair around room and into hall, yelling, cussing, throwing crayons paper, and left over food and drink. Redirection by this nurse and security and [employer] ineffective. EDP consulted for meds. Pt. Willingly took meds.

## 2015-05-16 MED ORDER — LOPERAMIDE HCL 2 MG PO CAPS
4.0000 mg | ORAL_CAPSULE | ORAL | Status: DC | PRN
  Administered 2015-05-16: 4 mg via ORAL
  Filled 2015-05-16: qty 2

## 2015-05-16 MED ORDER — LOPERAMIDE HCL 2 MG PO CAPS
2.0000 mg | ORAL_CAPSULE | Freq: Once | ORAL | Status: AC
  Administered 2015-05-16: 2 mg via ORAL
  Filled 2015-05-16: qty 1

## 2015-05-16 MED ORDER — HYDROXYZINE HCL 25 MG PO TABS
50.0000 mg | ORAL_TABLET | Freq: Two times a day (BID) | ORAL | Status: DC
  Administered 2015-05-16 – 2015-05-30 (×28): 50 mg via ORAL
  Filled 2015-05-16 (×27): qty 2

## 2015-05-16 MED ORDER — GABAPENTIN 100 MG PO CAPS
200.0000 mg | ORAL_CAPSULE | Freq: Three times a day (TID) | ORAL | Status: DC
  Administered 2015-05-16 – 2015-05-30 (×42): 200 mg via ORAL
  Filled 2015-05-16 (×44): qty 2

## 2015-05-16 NOTE — ED Notes (Signed)
Pt. Noted sleeping in room. No complaints or concerns voiced. No distress or abnormal behavior noted. Will continue to monitor with security cameras. Q 15 minute rounds continue. 

## 2015-05-16 NOTE — ED Notes (Signed)
On the bathroom

## 2015-05-16 NOTE — ED Notes (Signed)
Breakfast is late-graham crackers/peanut butter given

## 2015-05-16 NOTE — ED Notes (Signed)
Pt reports he has had another episode of diarrhea

## 2015-05-16 NOTE — ED Notes (Signed)
Pt. Noted in room. No complaints or concerns voiced. No distress or abnormal behavior noted. Will continue to monitor with security cameras. Q 15 minute rounds continue. 

## 2015-05-16 NOTE — ED Notes (Signed)
Pt. Noted in room. No distress or abnormal behavior noted. Will continue to monitor with security cameras. Q 15 minute rounds continue. 

## 2015-05-16 NOTE — Progress Notes (Addendum)
Re-Assessment Note:  This Probation officer met with the patient at his bedside.  Patient denies any changes in his mood since admission.  Patient reports last night becoming upset as a result broke up multiple crayons and made superficial cuts to his left wrist.  He is unable to remember what he used to make these superficial cuts.  Patient reports currently having auditory hallucinations telling him others are against him therefore he wants to lash out.  He is asking for inpatient treatment.   This Probation officer informed nursing staff of the cuts to his wrist and the need to take any possible objects out of the room.  this Probation officer consulted with Dr. Darleene Cleaver it is recommended to refer inpatient treatment.      Chesley Noon, MSW, Darlyn Read [facility] Triage Specialist [phone]

## 2015-05-16 NOTE — Progress Notes (Signed)
Disposition CSW completed additional referrals for patient to the following inpatient psych facilities:  [facility]  CSW will be available as needed for placement.  Seward Speck [facility] Disposition CSW [phone]

## 2015-05-16 NOTE — ED Notes (Signed)
Report received from Lizbeth Bark RN. Pt. Alert and oriented in no distress denies  HI, AVH and pain.  Pt. States he still has SI without plan. Pt. Instructed to come to me with problems or concerns.Will continue to monitor for safety via security cameras and Q 15 minute checks.

## 2015-05-16 NOTE — ED Notes (Signed)
Up to the bathroom 

## 2015-05-16 NOTE — ED Notes (Signed)
Up tot he bathroom to shower and change scrubs 

## 2015-05-16 NOTE — ED Notes (Signed)
Up in the hall. 

## 2015-05-16 NOTE — ED Notes (Signed)
Ate 100% of supper, watching tv

## 2015-05-16 NOTE — ED Notes (Signed)
On the phone 

## 2015-05-16 NOTE — ED Notes (Signed)
Pt. C/o insomnia/anxiety.

## 2015-05-17 DIAGNOSIS — F3160 Bipolar disorder, current episode mixed, unspecified: Secondary | ICD-10-CM | POA: Diagnosis not present

## 2015-05-17 DIAGNOSIS — R45851 Suicidal ideations: Secondary | ICD-10-CM | POA: Diagnosis not present

## 2015-05-17 NOTE — ED Notes (Signed)
Pt. Noted sleeping in room. No complaints or concerns voiced. No distress or abnormal behavior noted. Will continue to monitor with security cameras. Q 15 minute rounds continue. 

## 2015-05-17 NOTE — ED Notes (Signed)
Up to the bathroom 

## 2015-05-17 NOTE — ED Notes (Signed)
CSW into see 

## 2015-05-17 NOTE — ED Notes (Signed)
Dr Mervyn Skeeters and Catha Nottingham DNP into see.  Pt reports that he is still having continuious suicidal thoughts, denies hi/avh

## 2015-05-17 NOTE — ED Notes (Signed)
Pt. Noted in room. No complaints or concerns voiced. No distress or abnormal behavior noted. Will continue to monitor with security cameras. Q 15 minute rounds continue. 

## 2015-05-17 NOTE — ED Notes (Signed)
Pt. Noted in room. No complaints or concerns voiced. No distress or abnormal behavior noted. Will continue to monitor with security cameras. Q 15 minute rounds continue. Sandwich and soft drink given. 

## 2015-05-17 NOTE — ED Notes (Signed)
Report received from Lizbeth Bark RN. Pt. Alert and oriented in no distress denies HI, AVH and pain.  Pt. Still c/o SI  To take meds . Pt. Instructed to come to me with problems or concerns.Will continue to monitor for safety via security cameras and Q 15 minute checks.

## 2015-05-17 NOTE — ED Notes (Signed)
On the phone 
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
## 2015-05-17 NOTE — ED Notes (Signed)
Up on the phone 

## 2015-05-17 NOTE — ED Notes (Signed)
In the bathroom to shower and change scrubs 

## 2015-05-18 MED ORDER — LORAZEPAM 1 MG PO TABS
1.0000 mg | ORAL_TABLET | Freq: Once | ORAL | Status: AC
  Administered 2015-05-18: 1 mg via ORAL
  Filled 2015-05-18: qty 1

## 2015-05-18 MED ORDER — LORAZEPAM 1 MG PO TABS
1.0000 mg | ORAL_TABLET | Freq: Once | ORAL | Status: AC
Start: 2015-05-18 — End: 2015-05-19
  Administered 2015-05-19: 1 mg via ORAL
  Filled 2015-05-18 (×2): qty 1

## 2015-05-18 NOTE — BH Assessment (Signed)
Reassessment note: Pt was irritable but calm with Clinical research associate. He states that he is still feeling suicidal, homicidal but denies A/V hallucinations. He rates his depression a 10 out of 10 and his anxiety a 10 out of 10. He states that he only slept 3 hours last night and feels like "the medications aren't working". He states that he wants to go to [facility] and has been there before. He says that they were helpful.   Kateri Plummer, M.S., LPCA, [place], [facility] Licensed Professional Counselor Associate  Triage Specialist  [facility]  Therapeutic Triage Services Phone: [phone] Fax: [phone]

## 2015-05-18 NOTE — ED Notes (Signed)
Pt. Noted sleeping in room. No complaints or concerns voiced. No distress or abnormal behavior noted. Will continue to monitor with security cameras. Q 15 minute rounds continue. 

## 2015-05-18 NOTE — ED Notes (Signed)
Pt AAO x 3, no distress noted, calm & cooperative, speaking with TTS counselor at present.  MOnitoring for safety, Q 15 min checks in effect.

## 2015-05-18 NOTE — ED Notes (Signed)
Pt. Noted in room. Complains of insomnia voiced. Pt. Counseled concerning naps during the day upsetting the sleep cycle. No acute distress or abnormal behavior noted. Will continue to monitor with security cameras. Q 15 minute rounds continue.

## 2015-05-18 NOTE — ED Notes (Signed)
Pt requested meds for anxiety then refused the meds ordered by MD, stating they would not work.

## 2015-05-18 NOTE — ED Notes (Addendum)
Pt took a comb and staple from a book and scratched his arms.  Comb and staple removed from room. No active bleeding noted. Superficial marks noted to Left forearm.

## 2015-05-18 NOTE — ED Notes (Signed)
Patient up at nurse's station yelling obsinities at staff.  MHT had asked nurse how old patient was and nurse replied 19.  Patient overheard staff and went off on MHT calling her a "bitch."  "I'm going to go off.  You need to order me some medication now!"  Explained to patient that she had just inquired how old he was.  Also informed patient that if he didn't calm down, the phone would not be turned on for him.  Patient stated, "I'll just be quiet for a few minutes so I can use the phone.  You can't stop me from acting out!"  MHT and nurse attempted therapeutic discussion with patient.  He is now calm and using the phone.

## 2015-05-19 MED ORDER — QUETIAPINE FUMARATE ER 50 MG PO TB24
50.0000 mg | ORAL_TABLET | Freq: Every day | ORAL | Status: DC
  Administered 2015-05-19 – 2015-05-20 (×2): 50 mg via ORAL
  Filled 2015-05-19 (×3): qty 1

## 2015-05-19 NOTE — Clinical Social Work Note (Signed)
CSW met with pt for reassessment.  Pt stated that he was "still depressed and suicidal."  Pt showed CSW the cuts he made to his left arm stating that he found a staple on the floor.  Pt stated that he has been talking to his mom and she "knows his the best".  Pt wants inpatient treatment.  Dede Query LCSW [facility] Triage

## 2015-05-19 NOTE — ED Notes (Signed)
Pt. Is at nurses station asking for his meds.  He said he would not take them unless he had ativan.  MD denied his request for ativan and pt. Refused all morning meds.  He is being verbally aggressive and very irritable.

## 2015-05-19 NOTE — ED Notes (Signed)
Pt AAO x 3, no distress noted, calm & cooperative at present,  Monitoring for safety, Q 15 min checks in effect.

## 2015-05-19 NOTE — ED Notes (Signed)
Pt asleep at this time. RN notified and gave permission to obtain vital signs when pt awakes.

## 2015-05-19 NOTE — ED Notes (Signed)
Pt presented to desk requesting to take his meds.

## 2015-05-19 NOTE — ED Notes (Signed)
Family visiting with pt at present. 

## 2015-05-19 NOTE — ED Notes (Signed)
On report this am it was reported that patient was found cutting himself last night with paperclip.  Pt was in shower first thing this shift and when we checked video monitor it was noted that pt. Was cutting again.  We confronted patient and found him with a very small staple.  Multiple scratches and cuts were noted at wrist and up forearm.    He was very irritable and angry that we had a camera in restroom.  He was reassured that we were only using video for pt. Safety.  His room was searched and no other contraband was found.  Video monitoring and 15 minute checks continue.  NP notified.  Pt also refused to eat his breakfast because he has no utensils  .  He again was reassured that it was all for safety reasons.

## 2015-05-19 NOTE — BH Assessment (Signed)
[facility] Assessment Progress Note  The following facilities have been contacted to seek placement for this pt, with results as noted:  Beds available, information sent, decision pending:  [place]   No beds available, but accepting referrals for future consideration; information sent:  [facility]   At capacity:  [facility]   Doylene Canning, [place] Triage Specialist [phone]

## 2015-05-19 NOTE — Progress Notes (Signed)
Pt referred to [facility]. [facility] auth #: [id number] from 9/27 to 10/1. CSW completed phone demographics with Junious Dresser at [facility]. CSW faxed referral, pending review for [facility] waitlist.   Olga Coaster, LCSW  Clinical Social Work  [facility] [phone]

## 2015-05-20 MED ORDER — QUETIAPINE FUMARATE 50 MG PO TABS
50.0000 mg | ORAL_TABLET | Freq: Once | ORAL | Status: AC
  Administered 2015-05-20: 50 mg via ORAL
  Filled 2015-05-20: qty 1

## 2015-05-20 NOTE — Progress Notes (Signed)
Patient referred at: [facility] - per [facility], has beds, fax referral. [facility] - per Lesly Dukes, d/c tomorrow, fax referral for review. [facility] - per Jill Alexanders, 2 older adult beds, fax referral for the waitlist.  Referral followed up at: [facility]  At capacity: [facility] - per intake, low acuity only. [facility] - per Arlys John  Left voicemail inquiring about beds at: [facility] [place], [place] Disposition staff 05/20/2015 5:01 PM

## 2015-05-20 NOTE — BH Assessment (Signed)
[facility] Assessment Progress Note  Per Renee Pain at [facility], pt is now on their wait list.  Doylene Canning, MA Triage Specialist [phone]

## 2015-05-20 NOTE — ED Notes (Signed)
Pt AAO x 3, no distress noted, calm & cooperative at present,  Pt reports he feels down about being here.  Remains SI, monitoring for safety, Q checks in effect.

## 2015-05-20 NOTE — BH Assessment (Signed)
[facility] Assessment Progress Note  The following facilities have been contacted to seek placement for this pt, with results as noted:  Beds available, information sent, decision pending:  [facility]   At capacity:  [facility] [place], [place] Triage Specialist [phone]

## 2015-05-20 NOTE — BH Assessment (Signed)
Patient was assessed at 9:20AM. Patient states that he is "not doing good." Patient states that he is not currently suicidal and does not have thoughts of harming himself but states "I would if I leave I feel safe in here." Patient states that he would have the plan to take all of his medications and overdose. Patient denies HI but states "that changes a lot, I have a bad temper." Patient states that he "sometimes" feels that people are "talking about me or against me" and states that he feels that this makes him paranoid. Patient states that he would like "inpatient treatment somewhere."   Patient is alert and oriented x4. Patient was standing for the majority of the assessment "looking for numbers on a paper." Patient states that he cannot remember telephone numbers and feels that someone stole the numbers.  Informed patients nurse and psychiatrist.   Dr. Jannifer Franklin continues to recommend inpatient treatment at this time.
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
## 2015-05-20 NOTE — ED Notes (Signed)
Tray found in pt's room where it was written with a crayon "Bitch I want to Fucking die."

## 2015-05-21 DIAGNOSIS — R45851 Suicidal ideations: Secondary | ICD-10-CM | POA: Diagnosis not present

## 2015-05-21 DIAGNOSIS — F3160 Bipolar disorder, current episode mixed, unspecified: Secondary | ICD-10-CM | POA: Diagnosis not present

## 2015-05-21 MED ORDER — QUETIAPINE FUMARATE ER 50 MG PO TB24
50.0000 mg | ORAL_TABLET | Freq: Two times a day (BID) | ORAL | Status: DC
  Administered 2015-05-21 – 2015-05-30 (×18): 50 mg via ORAL
  Filled 2015-05-21 (×20): qty 1

## 2015-05-21 NOTE — ED Notes (Signed)
Pt seems very sad today.  He is spending most of the day in bed with minimal contact.  He states he is still suicidal.  Denies AVH and HI.  He is calm and cooperative.  Video monitoring and 15 minute checks continue.

## 2015-05-21 NOTE — Consult Note (Signed)
  Psychiatric Specialty Exam: Physical Exam  ROS  Blood pressure 93/57, pulse 54, temperature 97.6 F (36.4 C), temperature source Oral, resp. rate 16, SpO2 98 %.There is no weight on file to calculate BMI.  General Appearance: Fairly Groomed  Patent attorney::  Good  Speech:  Clear and Coherent  Volume:  Normal  Mood:  Depressed and Irritable  Affect:  Depressed  Thought Process:  Coherent  Orientation:  Full (Time, Place, and Person)  Thought Content:  Negative  Suicidal Thoughts:  Yes.  with intent/plan  Homicidal Thoughts:  No  Memory:  Immediate;   Good Recent;   Good Remote;   Good  Judgement:  Impaired  Insight:  Shallow  Psychomotor Activity:  Normal  Concentration:  Good  Recall:  Good  Fund of Knowledge:  Good  Language:  Good  Akathisia:  Negative  Handed:  Right  AIMS (if indicated):     Assets:  Communication Skills Desire for Improvement  ADL's:  Intact  Cognition:  WNL  Sleep:   says he has trouble getting to sleep and staying asleep   Mr Baney says he is still having suicidal thoughts and if discharged would try to kill himself.  Says he has anger problems that cause him to hurt himself, to try to kill himself and to sometimes want to hurt others.  Says he wants to get more help because he does not want to be this way. Little visits do not help, he says, "I need a long time".  Has been inpatient 10 times at least he says including [facility].  Last inpatient wa about 2 weeks ago and "they discharged me too soon". Will increase Seroquel XR to 50 mg bid as it helps control his anxiety and anger he says.  "I used to take 200 mg".  Mixed bipolar I disorder   Plan:  Increased Seroquel to bid, will submit request to [facility] for admission and continue to look for inpatient elsewhere as well.  Carolanne Grumbling, M.D. Psychiatry

## 2015-05-21 NOTE — ED Notes (Signed)
Pt AAO x 3, no distress noted, labile, irritable about not being placed yet. Remains SI, monitoring for safety, Q 15 min checks in effect.

## 2015-05-21 NOTE — BH Assessment (Signed)
[facility] Assessment Progress Note  The following facilities have been contacted to seek placement for this pt, with results as noted:  Beds available, information sent, decision pending:  [facility] [place] [facility] [place] [facility]   At capacity:  [facility] (has retained referral from yesterday) [facility] [place], [place] Triage Specialist [phone]
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
## 2015-05-21 NOTE — Progress Notes (Signed)
Per Vivien Rossetti at [facility], pt remains on waitlist.   Olga Coaster, LCSW  Clinical Social Work  [facility] [phone]

## 2015-05-21 NOTE — ED Notes (Signed)
Pt complains of extreme agitation.  Attempted to discuss ways of coping.  Medicated per order.  15 minute checks continue.

## 2015-05-22 MED ORDER — ZIPRASIDONE MESYLATE 20 MG IM SOLR
20.0000 mg | Freq: Once | INTRAMUSCULAR | Status: AC
  Administered 2015-05-22: 20 mg via INTRAMUSCULAR
  Filled 2015-05-22: qty 20

## 2015-05-22 MED ORDER — DIPHENHYDRAMINE HCL 50 MG/ML IJ SOLN
25.0000 mg | Freq: Once | INTRAMUSCULAR | Status: AC
  Administered 2015-05-22: 25 mg via INTRAMUSCULAR
  Filled 2015-05-22: qty 1
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
## 2015-05-22 NOTE — ED Notes (Signed)
Pt. Went to his room and allowed this nurse to perform injections.
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
## 2015-05-22 NOTE — ED Notes (Signed)
Pt. Noted sleeping in room. No complaints or concerns voiced. No distress or abnormal behavior noted. Will continue to monitor with security cameras. Q 15 minute rounds continue. 

## 2015-05-22 NOTE — BH Assessment (Signed)
[facility] Assessment Progress Note  Spoke to Robinette at [facility] at 09:00.  Pt remains on their wait list.  Doylene Canning, MA Triage Specialist [phone]

## 2015-05-22 NOTE — ED Notes (Signed)
Report received from [facility]. Pt. Alert and oriented in no distress denies AVH and pain.  Pt. States he is still SI without plan and HI towards people in general. Pt. Instructed to come to me with problems or concerns.Will continue to monitor for safety via security cameras and Q 15 minute checks.

## 2015-05-22 NOTE — ED Notes (Signed)
Patient is A&Ox4, currently calm and cooperative. Patient states that he is negative for A/V hallucinations but does have ocassional thoughts of hurting himself, states he will let staff know if he feels he is going to act on these thoughts. Medications given as scheduled and prn.  Elasha Tess, Wyman Songster, RN

## 2015-05-22 NOTE — ED Notes (Signed)
Pt. Noted in room. No complaints or concerns voiced. No acute distress noted. Pt. Starting to write on the wall with crayon. Pt. Informed that if he writes on the wall he will not be allowed crayons. Pt. Indicated he understood. Will continue to monitor with security cameras. Q 15 minute rounds continue.

## 2015-05-23 MED ORDER — ZIPRASIDONE MESYLATE 20 MG IM SOLR
20.0000 mg | Freq: Once | INTRAMUSCULAR | Status: AC
  Administered 2015-05-23: 20 mg via INTRAMUSCULAR
  Filled 2015-05-23: qty 20

## 2015-05-23 NOTE — ED Notes (Signed)
Pt. C/o anxiety. 

## 2015-05-23 NOTE — ED Notes (Signed)
Pt. Noted sleeping in room. No complaints or concerns voiced. No distress or abnormal behavior noted. Will continue to monitor with security cameras. Q 15 minute rounds continue. 

## 2015-05-23 NOTE — ED Notes (Signed)
Sandwich and soft drink given.  
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
## 2015-05-23 NOTE — ED Notes (Signed)
On the phone 

## 2015-05-23 NOTE — ED Notes (Signed)
Up to the bathroom 

## 2015-05-23 NOTE — ED Notes (Signed)
Pt brought tray up to the desk yelling, cursing throwing food at the window and mHt.  Pt redirected back to room w/o difficulty, still yelling and cursing.  Security into see, EDP updated. Pt returned to desk to clean up the food.

## 2015-05-23 NOTE — ED Notes (Signed)
TTS into see 

## 2015-05-23 NOTE — ED Notes (Signed)
Report received from Lizbeth Bark RN. Pt. Alert and oriented in no acute distress denies HI, AVH and pain.  Pt. Still c/o SI without plan. Pt. Walking around unit talking loudly. Pt. Instructed to come to me with problems or concerns.Will continue to monitor for safety via security cameras and Q 15 minute checks.
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
## 2015-05-23 NOTE — ED Notes (Signed)
Pt. Walking around unit in front of nurses station "shaking his booty" talking loudly. Pt. Redirected to his room.

## 2015-05-24 NOTE — ED Notes (Signed)
Pt. Noted sleeping in room. No complaints or concerns voiced. No distress or abnormal behavior noted. Will continue to monitor with security cameras. Q 15 minute rounds continue. 

## 2015-05-24 NOTE — ED Notes (Signed)
Up to the bathroom 

## 2015-05-24 NOTE — ED Notes (Signed)
Report received from Lizbeth Bark RN. Pt. Alert and oriented in no distress denies HI, AVH and pain.  Pt. Still c/o SI to OD on meds. Pt. Instructed to come to me with problems or concerns.Will continue to monitor for safety via security cameras and Q 15 minute checks.

## 2015-05-24 NOTE — ED Notes (Signed)
In the bathroom
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
## 2015-05-24 NOTE — ED Notes (Addendum)
Pt. Noted in room. Pt. C/o anxiety and insomnia. No distress or abnormal behavior noted. Will continue to monitor with security cameras. Q 15 minute rounds continue.

## 2015-05-24 NOTE — ED Notes (Signed)
Up on the phone 

## 2015-05-24 NOTE — ED Notes (Signed)
Up tot he bathroom to shower and change scrubs 

## 2015-05-24 NOTE — ED Notes (Signed)
Pt. Noted in room. No complaints or concerns voiced. No distress or abnormal behavior noted. Will continue to monitor with security cameras. Q 15 minute rounds continue. 

## 2015-05-24 NOTE — ED Notes (Signed)
tts into see 

## 2015-05-25 NOTE — ED Notes (Signed)
Pt. Noted sleeping in room. No complaints or concerns voiced. No distress or abnormal behavior noted. Will continue to monitor with security cameras. Q 15 minute rounds continue. 

## 2015-05-25 NOTE — BH Assessment (Signed)
Patient was reassessed and states "ain't nothing changed" when asked questions. Patient states "y'all ask these same questions everyday." LCSW explained to patient that we must assess everyone daily. Patient states that he is suicidal. Patient denies HI and AVH at this time. Patient does not appear to be responding to internal stimuli.  Patient was awake with his breakfast tray on his lap while watching cartoons during the assessment. Patient was calm and made good eye contact. Patient asked this writer if he would get approved for disability due to his hospital stay and states that he is "so depressed" and feels that disability may be best for him. This Clinical research associate informed the patient that she is not able to provide information about disability and redirected him to contact Social Services Disability for further information. LCSW informed him that we generally do not assist with disability in the ED but this information will be passed to the CSW to see if there is contact information to steer him in the right direction. Patient states that he does not have any other questions or concerns at this time.   Informed CSW of patient request for information regarding who to contact to get disability started.  Informed providers of patient status. Dr. Ladona Ridgel and Nanine Means, DNP continue to recommend inpatient hospitalization at this time.  [facility] o n [facility].   Davina Poke, LCSW Therapeutic Triage Specialist [facility] 05/25/2015 9:08 AM

## 2015-05-25 NOTE — BH Assessment (Signed)
[facility] Assessment Progress Note  The following facilities have been contacted to seek placement for this pt, with results as noted:  Beds available, information sent, decision pending:  [place] [facility]   Declined:  [facility] (needs high acuity, none currently available) [place] (lack of criteria)   At capacity:  [facility]    Doylene Canning, [place] Triage Specialist [phone]

## 2015-05-25 NOTE — BH Assessment (Signed)
[facility] Assessment Progress Note  At 08:43 I spoke to Robinette at [facility].  Pt remains on their wait list.  Doylene Canning, MA Triage Specialist [phone]

## 2015-05-25 NOTE — Progress Notes (Signed)
Report received from [place], [place] at [phone]. Pt is in bed alert and oriented x 4; in no acute distress. Pt at endorses severe depression and anxiety with some anger. Pt also endorses SI, and HI towards some outside the facility. Pt however denies pain, auditory and visual hallucination. He states, "I am very depression and angry; my anger is making me very anxious and am tired of life; I also have a person in mind that I can do something to." Pt contracted for safety. Support, encouragement, and safe environment provided. Pt instructed to come to RN with problems or concerns. Will continue to monitor for safety via security cameras and Q 15 minute checks.

## 2015-05-26 DIAGNOSIS — R45851 Suicidal ideations: Secondary | ICD-10-CM | POA: Diagnosis not present

## 2015-05-26 DIAGNOSIS — F3160 Bipolar disorder, current episode mixed, unspecified: Secondary | ICD-10-CM | POA: Diagnosis not present

## 2015-05-26 NOTE — BH Assessment (Signed)
[facility] Assessment Progress Note  At 08:55 I spoke to Robinette at [facility].  Pt remains on their wait list.  Doylene Canning, MA Triage Specialist [phone]

## 2015-05-26 NOTE — ED Notes (Signed)
Pt is refusing to eat his meals and believes our staff is purposely giving him food he doesn't like.  He is being verbally abusive to staff and when we offered to order him what he wanted he cursed and blamed [place].  We reminded him that it was for his own protection for him to not have utensils.   He is very irritable today.  Video monitoring and 15 minute checks continue.

## 2015-05-26 NOTE — Consult Note (Signed)
  Psychiatric Specialty Exam: Physical Exam  ROS  Blood pressure 111/51, pulse 61, temperature 98.5 F (36.9 C), temperature source Oral, resp. rate 16, SpO2 98 %.There is no weight on file to calculate BMI.  General Appearance: Casual  Eye Contact::  Good  Speech:  Clear and Coherent  Volume:  Normal  Mood:  Irritable  Affect:  Congruent  Thought Process:  Coherent  Orientation:  Full (Time, Place, and Person)  Thought Content:  Negative  Suicidal Thoughts:  Yes.  with intent/plan  Homicidal Thoughts:  No  Memory:  Immediate;   Good Recent;   Good Remote;   Good  Judgement:  poor  Insight:  Lacking  Psychomotor Activity:  Normal  Concentration:  Good  Recall:  Good  Fund of Knowledge:  Good  Language:  Good  Akathisia:  Negative  Handed:  Right  AIMS (if indicated):     Assets:  Communication Skills Physical Health  ADL's:  Intact  Cognition:  WNL  Sleep:   adequate though he complains of not getting to sleep well   Mixed bipolar I disorder [facility])   [name] remains the same.  He wants to be in a long term hospital and will not alter his story he says no matter how many times we ask him.  He has decided he needs to be on disability telling staff that he just cannot mke it on his own out there.  Continue seeking inpatient bed. Carolanne Grumbling, M.D.

## 2015-05-26 NOTE — ED Notes (Signed)
Pt AAO x 3, meal given, watching TV at present, no distress noted.  MOnitoring for safety, Q 15 min checks in effect.  Pt remains SI.
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
## 2015-05-27 NOTE — ED Notes (Addendum)
Pt is awake and alert, denies HI/SI. States I am not ready to be discharged.pt reports I thinks a staff member is talking about me, but this could just be in my head. Reports hx of paranoia. Pt then started to yell at staff "do you have a problem with me you "bitch ass nigga". Safety monitored and maintained.

## 2015-05-27 NOTE — ED Notes (Signed)
Pt is awake and alert, with c/o increased anxiety. Pt states "I can yell and talk loud all I want because its against the law for y'all to discharge me anyway". Verbal descalation by [employer], NP and SW attempted. Pt requested medication for anxiety. Safety monitored and maintained. [email or web address]

## 2015-05-27 NOTE — Progress Notes (Signed)
Per Coralee [facility] pt remains on [facility] wait[facility].  Olga Coaster, LCSW  Clinical Social Work  [facility] [phone]

## 2015-05-27 NOTE — ED Notes (Signed)
Pt is awake and alert. Pt verbalizes thoughts of self harm. Contracts for safety. Denies AVH. Pt expresses feelings of hopelessness. Safety maintained with q15 minute checks.

## 2015-05-28 NOTE — ED Notes (Signed)
Pt's grandmother into see 

## 2015-05-28 NOTE — ED Notes (Signed)
NAD, watching tv 

## 2015-05-28 NOTE — ED Notes (Signed)
Up to the bathroom 

## 2015-05-28 NOTE — Progress Notes (Signed)
Per Connie, pt remains on [facility] wait[facility].   Salar Molden, LCSW  Clinical Social Work  [facility] [phone]     

## 2015-05-28 NOTE — ED Notes (Signed)
Linens changed.

## 2015-05-28 NOTE — ED Notes (Signed)
Up to the bathroom to shower 

## 2015-05-28 NOTE — ED Notes (Signed)
Dr Ladona Ridgel and Julieanne Cotton NP into see.  Pt reports no changes
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
## 2015-05-28 NOTE — ED Notes (Signed)
Up on the phone 

## 2015-05-28 NOTE — ED Notes (Signed)
Up in hall, pt upset after talking w/ TTS and is concerned that he is going to be dc'd.  Pt reports that he is still suicidal and "will do it..." kill his self if he is dc'd.  Pt reminded that Dr Ladona Ridgel told him this morning that he is on the wait list for [facility]. (time approx)

## 2015-05-28 NOTE — ED Notes (Signed)
On the phone 

## 2015-05-28 NOTE — ED Notes (Signed)
tts into see 

## 2015-05-29 MED ORDER — GABAPENTIN 100 MG PO CAPS: 200.0000 mg | ORAL_CAPSULE | Freq: Three times a day (TID) | ORAL | Status: DC

## 2015-05-29 MED ORDER — DIVALPROEX SODIUM ER 500 MG PO TB24: 1000.0000 mg | ORAL_TABLET | Freq: Every day | ORAL | Status: DC

## 2015-05-29 MED ORDER — TRAZODONE HCL 100 MG PO TABS: 100.0000 mg | ORAL_TABLET | Freq: Every day | ORAL | Status: DC

## 2015-05-29 MED ORDER — QUETIAPINE FUMARATE ER 50 MG PO TB24: 50.0000 mg | ORAL_TABLET | Freq: Two times a day (BID) | ORAL | Status: DC

## 2015-05-29 NOTE — BH Assessment (Signed)
[facility] Assessment Progress Note  Per Sonya @ [facility], pt remains on wait list.  Berry Godsey, MA Triage Specialist [phone]     

## 2015-05-29 NOTE — ED Notes (Signed)
Pt. Noted in room. No complaints or concerns voiced. No distress or abnormal behavior noted. Will continue to monitor with security cameras. Q 15 minute rounds continue. 
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
## 2015-05-29 NOTE — BH Assessment (Signed)
Reassessment 05/29/2015:   Patient continues to report suicidal ideations. Sts that his symptoms have remained the same since presenting to the [facility]. Patient is unable to contract for safety. Patient complaining that he is not receiving finger foods as he should. Per Dr. Arfeen, patient will remain in ED awaiting placement at [facility].  

## 2015-05-29 NOTE — ED Notes (Signed)
Patient in the shower

## 2015-05-29 NOTE — ED Notes (Signed)
Report received from [facility]. Pt. Alert and oriented in no distress denies HI, VH and pain.  Pt. States he still has some SI and hearing voices without command. Pt. Instructed to come to me with problems or concerns.Will continue to monitor for safety via security cameras and Q 15 minute checks.

## 2015-05-29 NOTE — ED Notes (Signed)
Pt. Noted sleeping in room. No complaints or concerns voiced. No distress or abnormal behavior noted. Will continue to monitor with security cameras. Q 15 minute rounds continue. 

## 2015-05-29 NOTE — BH Assessment (Signed)
[facility] Assessment Progress Note  Vivien Rossetti calls from [facility] to report that pt has been accepted to their facility.  Claudette Head, NP concurs with this decision.  EDP Bary Castilla, MD has initiated IVC.  IVC documents have been faxed to magistrate, and at [facility] confirmed receipt.  Pt's nurse has been notified.  Pt is to be transported via [facility].  Doylene Canning, MA Triage Specialist [phone]

## 2015-05-30 NOTE — ED Notes (Addendum)
Pt ambulatory w/o difficulty to [facility] w/ [place] Sheriff.  IVC papers, MAR report, assessment notes, face sheet, belongings, EMTELA, EKG, and transfer report sent w/ sheriff.

## 2015-05-30 NOTE — ED Notes (Signed)
In the bathroom

## 2015-05-30 NOTE — ED Notes (Signed)
Pt. Noted sleeping in room. No complaints or concerns voiced. No distress or abnormal behavior noted. Will continue to monitor with security cameras. Q 15 minute rounds continue. 

## 2015-05-30 NOTE — ED Provider Notes (Signed)
Accepted [facility].   1. Suicidal ideation      Melene Plan, DO 05/30/15 [phone]

## 2015-05-30 NOTE — ED Notes (Signed)
Up to the bathroom to shower 

## 2015-05-30 NOTE — ED Notes (Signed)
Sheriff is here to transport 

## 2016-08-11 ENCOUNTER — Encounter: Payer: Self-pay | Admitting: *Deleted

## 2016-08-11 ENCOUNTER — Emergency Department
Admission: EM | Admit: 2016-08-11 | Discharge: 2016-08-12 | Disposition: A | Discharge status: Home / Self Care | Payer: Self-pay | Attending: Emergency Medicine | Admitting: Emergency Medicine

## 2016-08-11 ENCOUNTER — Emergency Department: Payer: Self-pay

## 2016-08-11 DIAGNOSIS — Z79899 Other long term (current) drug therapy: Secondary | ICD-10-CM | POA: Insufficient documentation

## 2016-08-11 DIAGNOSIS — Z87891 Personal history of nicotine dependence: Secondary | ICD-10-CM | POA: Insufficient documentation

## 2016-08-11 DIAGNOSIS — N200 Calculus of kidney: Secondary | ICD-10-CM | POA: Insufficient documentation

## 2016-08-11 LAB — BASIC METABOLIC PANEL
ANION GAP: 11 (ref 5–15)
BUN: 23 mg/dL — ABNORMAL HIGH (ref 6–20)
CO2: 25 mmol/L (ref 22–32)
Calcium: 10.1 mg/dL (ref 8.9–10.3)
Chloride: 103 mmol/L (ref 101–111)
Creatinine, Ser: 1.11 mg/dL (ref 0.61–1.24)
Glucose, Bld: 82 mg/dL (ref 65–99)
POTASSIUM: 4.2 mmol/L (ref 3.5–5.1)
SODIUM: 139 mmol/L (ref 135–145)

## 2016-08-11 LAB — URINALYSIS, COMPLETE (UACMP) WITH MICROSCOPIC
BACTERIA UA: NONE SEEN
BILIRUBIN URINE: NEGATIVE
Glucose, UA: NEGATIVE mg/dL
KETONES UR: 5 mg/dL — AB
LEUKOCYTES UA: NEGATIVE
NITRITE: NEGATIVE
Protein, ur: 30 mg/dL — AB
Specific Gravity, Urine: 1.019 (ref 1.005–1.030)
Squamous Epithelial / LPF: NONE SEEN
pH: 5 (ref 5.0–8.0)

## 2016-08-11 LAB — CBC
HEMATOCRIT: 46 % (ref 40.0–52.0)
HEMOGLOBIN: 16 g/dL (ref 13.0–18.0)
MCH: 31.8 pg (ref 26.0–34.0)
MCHC: 34.7 g/dL (ref 32.0–36.0)
MCV: 91.4 fL (ref 80.0–100.0)
Platelets: 209 10*3/uL (ref 150–440)
RBC: 5.03 MIL/uL (ref 4.40–5.90)
RDW: 13.5 % (ref 11.5–14.5)
WBC: 7.9 10*3/uL (ref 3.8–10.6)

## 2016-08-11 MED ORDER — ONDANSETRON HCL 4 MG/2ML IJ SOLN
4.0000 mg | Freq: Once | INTRAMUSCULAR | Status: AC
  Administered 2016-08-11: 4 mg via INTRAVENOUS
  Filled 2016-08-11: qty 2

## 2016-08-11 MED ORDER — SODIUM CHLORIDE 0.9 % IV BOLUS (SEPSIS)
1000.0000 mL | Freq: Once | INTRAVENOUS | Status: AC
  Administered 2016-08-11: 1000 mL via INTRAVENOUS

## 2016-08-11 MED ORDER — KETOROLAC TROMETHAMINE 30 MG/ML IJ SOLN
30.0000 mg | Freq: Once | INTRAMUSCULAR | Status: AC
  Administered 2016-08-11: 30 mg via INTRAVENOUS
  Filled 2016-08-11: qty 1

## 2016-08-11 NOTE — ED Notes (Signed)
Pt c/o left flank pain, N/V beginning at 2000. Pt reports hx of kidney stones.

## 2016-08-11 NOTE — ED Triage Notes (Signed)
Pt to triage via wheelchair.  Pt has left flank pain for 1 day. Pt states blood in urine.  Pt also reports nausea.  hx of kidney stones.
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
## 2016-08-12 MED ORDER — MORPHINE SULFATE (PF) 4 MG/ML IV SOLN
2.0000 mg | Freq: Once | INTRAVENOUS | Status: AC
  Administered 2016-08-12: 2 mg via INTRAVENOUS

## 2016-08-12 MED ORDER — MORPHINE SULFATE (PF) 2 MG/ML IV SOLN
INTRAVENOUS | Status: AC
  Filled 2016-08-12: qty 1

## 2016-08-12 MED ORDER — OXYCODONE-ACETAMINOPHEN 5-325 MG PO TABS
1.0000 | ORAL_TABLET | Freq: Once | ORAL | Status: AC
  Administered 2016-08-12: 1 via ORAL
  Filled 2016-08-12: qty 1

## 2016-08-12 MED ORDER — ONDANSETRON 4 MG PO TBDP: 4.0000 mg | ORAL_TABLET | Freq: Three times a day (TID) | ORAL | 0 refills | Status: DC | PRN

## 2016-08-12 MED ORDER — OXYCODONE-ACETAMINOPHEN 5-325 MG PO TABS: 1.0000 | ORAL_TABLET | ORAL | 0 refills | Status: DC | PRN

## 2016-08-12 MED ORDER — TAMSULOSIN HCL 0.4 MG PO CAPS: 0.4000 mg | ORAL_CAPSULE | Freq: Every day | ORAL | 0 refills | Status: DC

## 2017-06-17 ENCOUNTER — Emergency Department
Admission: EM | Admit: 2017-06-17 | Discharge: 2017-06-17 | Disposition: A | Discharge status: Home / Self Care | Payer: Self-pay | Attending: Emergency Medicine | Admitting: Emergency Medicine

## 2017-06-17 ENCOUNTER — Encounter: Payer: Self-pay | Admitting: Emergency Medicine

## 2017-06-17 DIAGNOSIS — N200 Calculus of kidney: Secondary | ICD-10-CM | POA: Insufficient documentation

## 2017-06-17 DIAGNOSIS — Z79899 Other long term (current) drug therapy: Secondary | ICD-10-CM | POA: Insufficient documentation

## 2017-06-17 DIAGNOSIS — R109 Unspecified abdominal pain: Secondary | ICD-10-CM

## 2017-06-17 DIAGNOSIS — Z87891 Personal history of nicotine dependence: Secondary | ICD-10-CM | POA: Insufficient documentation

## 2017-06-17 LAB — CBC
HCT: 40.7 % (ref 40.0–52.0)
Hemoglobin: 14 g/dL (ref 13.0–18.0)
MCH: 31.8 pg (ref 26.0–34.0)
MCHC: 34.4 g/dL (ref 32.0–36.0)
MCV: 92.4 fL (ref 80.0–100.0)
PLATELETS: 225 10*3/uL (ref 150–440)
RBC: 4.4 MIL/uL (ref 4.40–5.90)
RDW: 14.1 % (ref 11.5–14.5)
WBC: 5.2 10*3/uL (ref 3.8–10.6)

## 2017-06-17 LAB — URINALYSIS, COMPLETE (UACMP) WITH MICROSCOPIC
BILIRUBIN URINE: NEGATIVE
Bacteria, UA: NONE SEEN
GLUCOSE, UA: NEGATIVE mg/dL
Ketones, ur: NEGATIVE mg/dL
Leukocytes, UA: NEGATIVE
Nitrite: NEGATIVE
PH: 7 (ref 5.0–8.0)
PROTEIN: NEGATIVE mg/dL
SQUAMOUS EPITHELIAL / LPF: NONE SEEN
Specific Gravity, Urine: 1.008 (ref 1.005–1.030)

## 2017-06-17 LAB — BASIC METABOLIC PANEL
Anion gap: 7 (ref 5–15)
BUN: 14 mg/dL (ref 6–20)
CALCIUM: 9.2 mg/dL (ref 8.9–10.3)
CO2: 27 mmol/L (ref 22–32)
CREATININE: 0.81 mg/dL (ref 0.61–1.24)
Chloride: 108 mmol/L (ref 101–111)
GFR calc Af Amer: >60 mL/min (ref >60)
GLUCOSE: 95 mg/dL (ref 65–99)
Potassium: 4.2 mmol/L (ref 3.5–5.1)
Sodium: 142 mmol/L (ref 135–145)

## 2017-06-17 MED ORDER — KETOROLAC TROMETHAMINE 10 MG PO TABS: 10.0000 mg | ORAL_TABLET | Freq: Three times a day (TID) | ORAL | 0 refills | Status: DC | PRN

## 2017-06-17 MED ORDER — KETOROLAC TROMETHAMINE 60 MG/2ML IM SOLN
60.0000 mg | Freq: Once | INTRAMUSCULAR | Status: AC
  Administered 2017-06-17: 60 mg via INTRAMUSCULAR
  Filled 2017-06-17: qty 2
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
## 2017-06-17 NOTE — ED Triage Notes (Signed)
Pt c/o left flank/LLQ pain. Hx kidney stones, feels same. Reports vomiting. Pain started today. Has had some blood in urine per report. Pt brought urine in ziplock bag; explained will need fresh urine specimen.  Also reports he saw a stone in urine today. Ambulatory. NAD. VSS

## 2017-06-17 NOTE — ED Notes (Signed)
Patient c/o left flank pain. No distress noted, patient resting comfortably in bed.

## 2017-06-17 NOTE — Discharge Instructions (Signed)
Please seek medical attention for any high fevers, chest pain, shortness of breath, change in behavior, persistent vomiting, bloody stool or any other new or concerning symptoms.  

## 2017-11-01 ENCOUNTER — Encounter: Payer: Self-pay | Admitting: Emergency Medicine

## 2017-11-01 ENCOUNTER — Other Ambulatory Visit: Payer: Self-pay

## 2017-11-01 ENCOUNTER — Emergency Department
Admission: EM | Admit: 2017-11-01 | Discharge: 2017-11-01 | Disposition: A | Discharge status: Home / Self Care | Payer: Self-pay | Attending: Emergency Medicine | Admitting: Emergency Medicine

## 2017-11-01 DIAGNOSIS — R21 Rash and other nonspecific skin eruption: Secondary | ICD-10-CM | POA: Insufficient documentation

## 2017-11-01 DIAGNOSIS — Z87891 Personal history of nicotine dependence: Secondary | ICD-10-CM | POA: Insufficient documentation

## 2017-11-01 DIAGNOSIS — Z79899 Other long term (current) drug therapy: Secondary | ICD-10-CM | POA: Insufficient documentation

## 2017-11-01 MED ORDER — TRIAMCINOLONE ACETONIDE 0.5 % EX CREA: 1.0000 "application " | TOPICAL_CREAM | Freq: Three times a day (TID) | CUTANEOUS | 0 refills | Status: DC

## 2017-11-01 MED ORDER — PREDNISONE 10 MG PO TABS: ORAL_TABLET | ORAL | 0 refills | Status: DC

## 2017-11-01 NOTE — ED Triage Notes (Signed)
Here for rash to arms and legs after starting working at [employer].  Tried a prescriptions rash cream "that starts with a T" of his grandma's that has not helped. NAD. VSS. No fever

## 2017-11-01 NOTE — Discharge Instructions (Signed)
Continue prednisone 3 tablets once a day for 3 days.  Begin using Kenalog cream to the area 3 times a day as needed for itching.  If this is not clearing up your rash you will need to see 1 of the dermatologist listed on your discharge papers.  Both their contact information and addresses are listed on your discharge papers.
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
## 2018-06-26 ENCOUNTER — Emergency Department
Admission: EM | Admit: 2018-06-26 | Discharge: 2018-06-26 | Disposition: A | Discharge status: Home / Self Care | Payer: Self-pay | Attending: Emergency Medicine | Admitting: Emergency Medicine

## 2018-06-26 ENCOUNTER — Other Ambulatory Visit: Payer: Self-pay

## 2018-06-26 DIAGNOSIS — J039 Acute tonsillitis, unspecified: Secondary | ICD-10-CM

## 2018-06-26 DIAGNOSIS — Z79899 Other long term (current) drug therapy: Secondary | ICD-10-CM | POA: Insufficient documentation

## 2018-06-26 DIAGNOSIS — Z87891 Personal history of nicotine dependence: Secondary | ICD-10-CM | POA: Insufficient documentation

## 2018-06-26 LAB — INFLUENZA PANEL BY PCR (TYPE A & B)
Influenza A By PCR: NEGATIVE
Influenza B By PCR: NEGATIVE

## 2018-06-26 LAB — GROUP A STREP BY PCR: Group A Strep by PCR: NOT DETECTED

## 2018-06-26 MED ORDER — LIDOCAINE VISCOUS HCL 2 % MT SOLN: 10.0000 mL | OROMUCOSAL | 0 refills | Status: DC | PRN

## 2018-06-26 MED ORDER — AMOXICILLIN-POT CLAVULANATE 875-125 MG PO TABS: 1.0000 | ORAL_TABLET | Freq: Two times a day (BID) | ORAL | 0 refills | Status: AC
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
## 2018-06-26 NOTE — ED Triage Notes (Signed)
Pt c./o fever with cough, congestion and runny since last night. States he took liquid dayquil at 11am today.

## 2018-06-26 NOTE — ED Notes (Signed)
See triage note  Presents with fever ,body aches and sore throat since yesterday  Low grade fever on arrival

## 2018-09-16 ENCOUNTER — Emergency Department (HOSPITAL_COMMUNITY)
Admission: EM | Admit: 2018-09-16 | Discharge: 2018-09-17 | Disposition: A | Discharge status: Home / Self Care | Payer: Self-pay | Attending: Emergency Medicine | Admitting: Emergency Medicine

## 2018-09-16 ENCOUNTER — Ambulatory Visit (HOSPITAL_COMMUNITY)
Admission: RE | Admit: 2018-09-16 | Discharge: 2018-09-16 | Disposition: A | Discharge status: Home / Self Care | Payer: Federal, State, Local not specified - Other | Attending: Psychiatry | Admitting: Psychiatry

## 2018-09-16 ENCOUNTER — Encounter (HOSPITAL_COMMUNITY): Payer: Self-pay | Admitting: Emergency Medicine

## 2018-09-16 DIAGNOSIS — Z79899 Other long term (current) drug therapy: Secondary | ICD-10-CM | POA: Insufficient documentation

## 2018-09-16 DIAGNOSIS — Z915 Personal history of self-harm: Secondary | ICD-10-CM | POA: Insufficient documentation

## 2018-09-16 DIAGNOSIS — F319 Bipolar disorder, unspecified: Secondary | ICD-10-CM | POA: Insufficient documentation

## 2018-09-16 DIAGNOSIS — Z87891 Personal history of nicotine dependence: Secondary | ICD-10-CM | POA: Insufficient documentation

## 2018-09-16 DIAGNOSIS — R45851 Suicidal ideations: Secondary | ICD-10-CM | POA: Insufficient documentation

## 2018-09-16 DIAGNOSIS — F4310 Post-traumatic stress disorder, unspecified: Secondary | ICD-10-CM | POA: Insufficient documentation

## 2018-09-16 DIAGNOSIS — F329 Major depressive disorder, single episode, unspecified: Secondary | ICD-10-CM

## 2018-09-16 DIAGNOSIS — F32A Depression, unspecified: Secondary | ICD-10-CM

## 2018-09-16 DIAGNOSIS — F603 Borderline personality disorder: Secondary | ICD-10-CM | POA: Insufficient documentation

## 2018-09-16 DIAGNOSIS — F3161 Bipolar disorder, current episode mixed, mild: Secondary | ICD-10-CM | POA: Diagnosis present

## 2018-09-16 DIAGNOSIS — F332 Major depressive disorder, recurrent severe without psychotic features: Secondary | ICD-10-CM | POA: Insufficient documentation

## 2018-09-16 LAB — COMPREHENSIVE METABOLIC PANEL
ALK PHOS: 61 U/L (ref 38–126)
ALT: 23 U/L (ref 0–44)
ANION GAP: 7 (ref 5–15)
AST: 21 U/L (ref 15–41)
Albumin: 4.7 g/dL (ref 3.5–5.0)
BUN: 12 mg/dL (ref 6–20)
CO2: 28 mmol/L (ref 22–32)
CREATININE: 0.75 mg/dL (ref 0.61–1.24)
Calcium: 9.3 mg/dL (ref 8.9–10.3)
Chloride: 107 mmol/L (ref 98–111)
GFR calc non Af Amer: >60 mL/min (ref >60)
GLUCOSE: 81 mg/dL (ref 70–99)
Potassium: 4 mmol/L (ref 3.5–5.1)
Sodium: 142 mmol/L (ref 135–145)
Total Bilirubin: 0.5 mg/dL (ref 0.3–1.2)
Total Protein: 7.6 g/dL (ref 6.5–8.1)

## 2018-09-16 LAB — RAPID URINE DRUG SCREEN, HOSP PERFORMED
Amphetamines: NOT DETECTED
BARBITURATES: NOT DETECTED
Benzodiazepines: NOT DETECTED
Cocaine: NOT DETECTED
OPIATES: NOT DETECTED
TETRAHYDROCANNABINOL: POSITIVE — AB

## 2018-09-16 LAB — CBC
HEMATOCRIT: 44 % (ref 39.0–52.0)
Hemoglobin: 14.5 g/dL (ref 13.0–17.0)
MCH: 32 pg (ref 26.0–34.0)
MCHC: 33 g/dL (ref 30.0–36.0)
MCV: 97.1 fL (ref 80.0–100.0)
NRBC: 0 % (ref 0.0–0.2)
Platelets: 208 10*3/uL (ref 150–400)
RBC: 4.53 MIL/uL (ref 4.22–5.81)
RDW: 13.3 % (ref 11.5–15.5)
WBC: 5.8 10*3/uL (ref 4.0–10.5)

## 2018-09-16 LAB — SALICYLATE LEVEL: Salicylate Lvl: <7 mg/dL (ref 2.8–30.0)

## 2018-09-16 LAB — ACETAMINOPHEN LEVEL: Acetaminophen (Tylenol), Serum: <10 ug/mL — ABNORMAL LOW (ref 10–30)

## 2018-09-16 LAB — ETHANOL

## 2018-09-16 LAB — VALPROIC ACID LEVEL: Valproic Acid Lvl: <10 ug/mL — ABNORMAL LOW (ref 50.0–100.0)

## 2018-09-16 MED ORDER — ACETAMINOPHEN 325 MG PO TABS: 650.0000 mg | ORAL_TABLET | ORAL | Status: DC | PRN

## 2018-09-16 MED ORDER — ZOLPIDEM TARTRATE 5 MG PO TABS
5.0000 mg | ORAL_TABLET | Freq: Every evening | ORAL | Status: DC | PRN
  Administered 2018-09-16: 5 mg via ORAL
  Filled 2018-09-16: qty 1

## 2018-09-16 MED ORDER — ALUM & MAG HYDROXIDE-SIMETH 200-200-20 MG/5ML PO SUSP: 30.0000 mL | Freq: Four times a day (QID) | ORAL | Status: DC | PRN

## 2018-09-16 MED ORDER — ONDANSETRON HCL 4 MG PO TABS: 4.0000 mg | ORAL_TABLET | Freq: Three times a day (TID) | ORAL | Status: DC | PRN

## 2018-09-16 NOTE — Progress Notes (Signed)
Received [name] in his room, awke in bed with the TV on and admiring the pictures of his cats. He stated feeling anxious and concerned about his length of stay here  in the [facility] . He requested to know his plan of care. He had a difficult time drifting off to sleep, He was given Ambien then Trazodone and finally went to sleep at 0145 hrs. Earlier he verbalized his desire to be admitted to [facility] for help and restart his medications.

## 2018-09-16 NOTE — H&P (Signed)
Behavioral Health Medical Screening Exam  [name] is an 23 y.o. male.  Total Time spent with patient: 20 minutes  Psychiatric Specialty Exam: Physical Exam  Nursing note and vitals reviewed. Constitutional: He is oriented to person, place, and time. He appears well-developed and well-nourished.  Cardiovascular: Normal rate.  Respiratory: Effort normal.  Musculoskeletal: Normal range of motion.  Neurological: He is alert and oriented to person, place, and time.  Skin: Skin is warm.    Review of Systems  Constitutional: Negative.   HENT: Negative.   Eyes: Negative.   Respiratory: Negative.   Cardiovascular: Negative.   Gastrointestinal: Negative.   Genitourinary: Negative.   Musculoskeletal: Negative.   Skin: Negative.   Neurological: Negative.   Endo/Heme/Allergies: Negative.   Psychiatric/Behavioral: Positive for depression, hallucinations and suicidal ideas.    Blood pressure (!) 150/92, pulse 64, temperature 98.5 F (36.9 C), resp. rate 18, SpO2 100 %.There is no height or weight on file to calculate BMI.  General Appearance: Fairly Groomed  Eye Contact:  Fair  Speech:  Clear and Coherent and Normal Rate  Volume:  Decreased  Mood:  Depressed  Affect:  Depressed and Flat  Thought Process:  Linear and Descriptions of Associations: Intact  Orientation:  Full (Time, Place, and Person)  Thought Content:  WDL  Suicidal Thoughts:  Yes.  with intent/plan  Homicidal Thoughts:  No  Memory:  Immediate;   Good Recent;   Good Remote;   Good  Judgement:  Fair  Insight:  Fair  Psychomotor Activity:  Normal  Concentration: Concentration: Good and Attention Span: Good  Recall:  Good  Fund of Knowledge:Good  Language: Good  Akathisia:  No  Handed:  Right  AIMS (if indicated):     Assets:  Communication Skills Desire for Improvement Housing Social Support Transportation  Sleep:       Musculoskeletal: Strength & Muscle Tone: within normal limits Gait & Station:  normal Patient leans: N/A  Blood pressure (!) 150/92, pulse 64, temperature 98.5 F (36.9 C), resp. rate 18, SpO2 100 %.  Recommendations:  No medical emergency but will be sent to [facility] for med clearance and no appropriate beds at [facility], FNP 09/16/2018, 4:31 PM
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
## 2018-09-16 NOTE — ED Triage Notes (Signed)
Patient here from home brought in by grandmother with complaints of suicidal ideation "all year". When ask what is causing the SI patient reports "my brain". Denies A/V hallucinations. Plan to OD on sleeping pills.

## 2018-09-17 DIAGNOSIS — F3161 Bipolar disorder, current episode mixed, mild: Secondary | ICD-10-CM | POA: Diagnosis present

## 2018-09-17 MED ORDER — TRAZODONE HCL 100 MG PO TABS
100.0000 mg | ORAL_TABLET | Freq: Every evening | ORAL | Status: DC | PRN
  Administered 2018-09-17: 100 mg via ORAL
  Filled 2018-09-17: qty 1

## 2018-09-17 MED ORDER — QUETIAPINE FUMARATE ER 50 MG PO TB24: 100.0000 mg | ORAL_TABLET | Freq: Every day | ORAL | Status: DC

## 2018-09-17 MED ORDER — QUETIAPINE FUMARATE ER 50 MG PO TB24: 100.0000 mg | ORAL_TABLET | Freq: Every day | ORAL | 0 refills | Status: DC

## 2018-09-17 NOTE — BH Assessment (Signed)
[facility] Assessment Progress Note  Per Juanetta Beets, DO, this pt does not require psychiatric hospitalization at this time.  Pt is to be discharged from [facility] with recommendation to follow up with [facility] in [facility] [place] [facility], pt's community of residence.  This has been included in pt's discharge instructions.  Pt's nurse, Morrie Sheldon, has been notified.  Doylene Canning, MA Triage Specialist [phone]

## 2018-09-17 NOTE — ED Notes (Signed)
Pt talking on hallway phone.  
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
## 2018-09-17 NOTE — Discharge Instructions (Signed)
For your behavioral health needs, you are advised to follow up with [facility]: ° °     [facility] °     [address] °     [place], [place] [address] °     [phone]

## 2018-09-17 NOTE — ED Notes (Signed)
Pt d/c home per MD order. Discharge summary reviewed with pt. Pt verbalizes understanding. Denies SI/HI/AVH. RX provided . Pt signed e-signature. Personal property returned. Pt ambulatory off unit.

## 2020-08-19 ENCOUNTER — Ambulatory Visit: Payer: Self-pay | Admitting: *Deleted

## 2020-08-19 NOTE — Telephone Encounter (Signed)
Pt is evasive historian. States fever 101.7, diarrhea, nausea and vomiting, dysuria. States fever started tonight at 2000. Took aleve, has not rechecked. States "I haven't had anything to eat or drink since Saturday." Later reported all the liquids and food he had today. Reports burning with urination. Advised UC in am, ED tonight if symptoms worsen. Pt verbalizes understanding.  Reason for Disposition . [1] Cloudy urine lasts > 24 hours AND [2] not cleared by increasing fluid intake  Answer Assessment - Initial Assessment Questions 1. TEMPERATURE: "What is the most recent temperature?"  "How was it measured?"      101.7 2. ONSET: "When did the fever start?"      8pm this evening 3. SYMPTOMS: "Do you have any other symptoms besides the fever?"  (e.g., colds, headache, sore throat, earache, cough, rash, diarrhea, vomiting, abdominal pain)     Nausea, vomiting diarrhea 4. CAUSE: If there are no symptoms, ask: "What do you think is causing the fever?"      "Burning when I pee." 5. CONTACTS: "Does anyone else in the family have an infection?"     no 6. TREATMENT: "What have you done so far to treat this fever?" (e.g., medications)    Aleve 7. IMMUNOCOMPROMISE: "Do you have of the following: diabetes, HIV positive, splenectomy, cancer chemotherapy, chronic steroid treatment, transplant patient, etc."     no 8. PREGNANCY: "Is there any chance you are pregnant?" "When was your last menstrual period?"     NA 9. TRAVEL: "Have you traveled out of the country in the last month?" (e.g., travel history, exposures)     no  Answer Assessment - Initial Assessment Questions 1. SYMPTOMS: "What symptoms are you concerned about?"     Burning with urination 2. ONSET:  "When did the symptoms start?"     *No Answer* 3. FEVER: "Is there a fever?" If Yes, ask: "What is the temperature, how was it measured, and when did it start?"     101.7 4. ABDOMINAL PAIN: "Is there any abdominal pain?" (e.g., Scale 1-10;  or mild, moderate, severe)     *No Answer* 5. URINE COLOR: "What color is the urine?"  "Is there blood present in the urine?" (e.g., clear, yellow, cloudy, tea-colored, blood streaks, bright red)     *No Answer* 6. ONSET: "When was the catheter inserted?"     *No Answer* 7. OTHER SYMPTOMS: "Do you have any other symptoms?" (e.g., back pain, bad urine odor)      *No Answer* 8. PREGNANCY: "Is there any chance you are pregnant?" "When was your last menstrual period?"     *No Answer*  Protocols used: URINARY CATHETER SYMPTOMS AND QUESTIONS-A-AH, FEVER-A-AH

## 2023-08-14 ENCOUNTER — Emergency Department (HOSPITAL_COMMUNITY): Payer: Self-pay

## 2023-08-14 ENCOUNTER — Emergency Department (HOSPITAL_COMMUNITY)
Admission: EM | Admit: 2023-08-14 | Discharge: 2023-08-14 | Disposition: A | Discharge status: Home / Self Care | Payer: Self-pay | Attending: Emergency Medicine | Admitting: Emergency Medicine

## 2023-08-14 ENCOUNTER — Encounter (HOSPITAL_COMMUNITY): Payer: Self-pay

## 2023-08-14 ENCOUNTER — Other Ambulatory Visit: Payer: Self-pay

## 2023-08-14 DIAGNOSIS — N132 Hydronephrosis with renal and ureteral calculous obstruction: Secondary | ICD-10-CM | POA: Insufficient documentation

## 2023-08-14 DIAGNOSIS — N200 Calculus of kidney: Secondary | ICD-10-CM

## 2023-08-14 DIAGNOSIS — N23 Unspecified renal colic: Secondary | ICD-10-CM

## 2023-08-14 LAB — URINALYSIS, ROUTINE W REFLEX MICROSCOPIC
Bilirubin Urine: NEGATIVE
Glucose, UA: NEGATIVE mg/dL
Ketones, ur: NEGATIVE mg/dL
Leukocytes,Ua: NEGATIVE
Nitrite: NEGATIVE
Protein, ur: NEGATIVE mg/dL
RBC / HPF: >50 RBC/hpf (ref 0–5)
Specific Gravity, Urine: 1.011 (ref 1.005–1.030)
pH: 7 (ref 5.0–8.0)

## 2023-08-14 LAB — CBC
HCT: 41.6 % (ref 39.0–52.0)
Hemoglobin: 14.4 g/dL (ref 13.0–17.0)
MCH: 31.9 pg (ref 26.0–34.0)
MCHC: 34.6 g/dL (ref 30.0–36.0)
MCV: 92 fL (ref 80.0–100.0)
Platelets: 209 10*3/uL (ref 150–400)
RBC: 4.52 MIL/uL (ref 4.22–5.81)
RDW: 12.4 % (ref 11.5–15.5)
WBC: 9.7 10*3/uL (ref 4.0–10.5)
nRBC: 0 % (ref 0.0–0.2)

## 2023-08-14 LAB — BASIC METABOLIC PANEL
Anion gap: 6 (ref 5–15)
BUN: 9 mg/dL (ref 6–20)
CO2: 25 mmol/L (ref 22–32)
Calcium: 9.1 mg/dL (ref 8.9–10.3)
Chloride: 106 mmol/L (ref 98–111)
Creatinine, Ser: 1.14 mg/dL (ref 0.61–1.24)
GFR, Estimated: >60 mL/min (ref >60)
Glucose, Bld: 108 mg/dL — ABNORMAL HIGH (ref 70–99)
Potassium: 3.9 mmol/L (ref 3.5–5.1)
Sodium: 137 mmol/L (ref 135–145)

## 2023-08-14 MED ORDER — OXYCODONE HCL 5 MG PO TABS: 5.0000 mg | ORAL_TABLET | Freq: Three times a day (TID) | ORAL | 0 refills | Status: AC | PRN

## 2023-08-14 MED ORDER — OXYCODONE HCL 5 MG PO TABS
5.0000 mg | ORAL_TABLET | Freq: Once | ORAL | Status: AC
  Administered 2023-08-14: 5 mg via ORAL
  Filled 2023-08-14: qty 1

## 2023-08-14 MED ORDER — TAMSULOSIN HCL 0.4 MG PO CAPS: 0.4000 mg | ORAL_CAPSULE | Freq: Every day | ORAL | 0 refills | Status: AC | PRN

## 2023-08-14 MED ORDER — IBUPROFEN 600 MG PO TABS: 600.0000 mg | ORAL_TABLET | Freq: Four times a day (QID) | ORAL | 0 refills | Status: AC | PRN

## 2023-08-14 MED ORDER — ONDANSETRON 4 MG PO TBDP
4.0000 mg | ORAL_TABLET | Freq: Once | ORAL | Status: AC
  Administered 2023-08-14: 4 mg via ORAL
  Filled 2023-08-14: qty 1

## 2023-08-14 MED ORDER — ONDANSETRON HCL 4 MG PO TABS: 4.0000 mg | ORAL_TABLET | Freq: Three times a day (TID) | ORAL | 0 refills | Status: AC | PRN

## 2023-08-14 NOTE — Discharge Instructions (Addendum)
Your CT scan showed that you are passing several small kidney stones on the left side.  The largest stone is about 5 mm, inside your ureter, outside the bladder.  This can be a painful process can take 2 to 3 weeks on average.  Continue drinking plenty of water.  I prescribed pain medicine as well as Flomax to help you push more water through your kidney.  Please call to make an appointment with a urologist if you are not passing your stone.  Their office number is included.

## 2023-08-14 NOTE — ED Triage Notes (Signed)
Patient brought in by [facility] for flank pain. Patient had three kidney stones one month ago in which he passed on his own. Left back pain, abdominal pain, with decreased urine output since yesterday. Alert oriented ambulatory.   EMS vitals  130/80 BP 70 HR 98 on room air

## 2023-08-14 NOTE — ED Provider Notes (Signed)
[place] EMERGENCY DEPARTMENT AT [facility] Provider Note   CSN: [id number] Arrival date & time: 08/14/23  5621     History  Chief Complaint  Patient presents with   Flank Pain    [name] is a 27 y.o. male with a history of recurring kidney stones presenting to ED with lower abdominal pain and left-sided flank pain, reported difficulty with urination and decreased urine output since yesterday.  Patient reports he passed 3 kidney stones about a month ago but is having pain issues ever since then.  He says he has frequent frank hematuria related to his kidney stones.  He says that his pain has not gotten better over-the-counter medications and he also feels nauseated.  HPI     Home Medications Prior to Admission medications   Medication Sig Start Date End Date Taking? Authorizing Provider  acetaminophen (TYLENOL) 500 MG tablet Take 1,000 mg by mouth 2 (two) times daily as needed for moderate pain (pain score 4-6), fever or headache.   Yes [provider]  ibuprofen (ADVIL) 600 MG tablet Take 1 tablet (600 mg total) by mouth every 6 (six) hours as needed for up to 30 doses for mild pain (pain score 1-3) or moderate pain (pain score 4-6). 08/14/23  Yes Jefrey Raburn, Kermit Balo, MD  melatonin 5 MG TABS Take 5 mg by mouth at bedtime.   Yes [provider]  ondansetron (ZOFRAN) 4 MG tablet Take 1 tablet (4 mg total) by mouth every 8 (eight) hours as needed for up to 12 doses for nausea or vomiting. 08/14/23  Yes Aleeha Boline, Kermit Balo, MD  oxyCODONE (ROXICODONE) 5 MG immediate release tablet Take 1 tablet (5 mg total) by mouth every 8 (eight) hours as needed for up to 12 doses for breakthrough pain. 08/14/23  Yes Terald Sleeper, MD  tamsulosin (FLOMAX) 0.4 MG CAPS capsule Take 0.4 mg by mouth every 12 (twelve) hours.   Yes [provider]  tamsulosin (FLOMAX) 0.4 MG CAPS capsule Take 1 capsule (0.4 mg total) by mouth daily as needed for up to 10 doses.  Once daily until kidney stone passes 08/14/23  Yes Cyani Kallstrom, Kermit Balo, MD      Allergies    Iohexol    Review of Systems   Review of Systems  Physical Exam Updated Vital Signs BP (!) 136/93 (BP Location: Left Arm)   Pulse 63   Temp 99.1 F (37.3 C) (Oral)   Resp 20   Ht 5\' 8"  (1.727 m)   Wt 64.9 kg   SpO2 100%   BMI 21.74 kg/m  Physical Exam Constitutional:      General: He is not in acute distress. HENT:     Head: Normocephalic and atraumatic.  Eyes:     Conjunctiva/sclera: Conjunctivae normal.     Pupils: Pupils are equal, round, and reactive to light.  Cardiovascular:     Rate and Rhythm: Normal rate and regular rhythm.  Pulmonary:     Effort: Pulmonary effort is normal. No respiratory distress.  Abdominal:     General: There is no distension.     Tenderness: There is no abdominal tenderness.  Skin:    General: Skin is warm and dry.  Neurological:     General: No focal deficit present.     Mental Status: He is alert. Mental status is at baseline.     ED Results / Procedures / Treatments   Labs (all labs ordered are listed, but only abnormal results  are displayed) Labs Reviewed  BASIC METABOLIC PANEL - Abnormal; Notable for the following components:      Result Value   Glucose, Bld 108 (*)    All other components within normal limits  URINALYSIS, ROUTINE W REFLEX MICROSCOPIC - Abnormal; Notable for the following components:   Hgb urine dipstick MODERATE (*)    Bacteria, UA RARE (*)    All other components within normal limits  CBC    EKG None  Radiology CT Renal Stone Study Result Date: 08/14/2023 CLINICAL DATA:  Left-sided flank pain and history of renal calculi. EXAM: CT ABDOMEN AND PELVIS WITHOUT CONTRAST TECHNIQUE: Multidetector CT imaging of the abdomen and pelvis was performed following the standard protocol without IV contrast. RADIATION DOSE REDUCTION: This exam was performed according to the departmental dose-optimization program which  includes automated exposure control, adjustment of the mA and/or kV according to patient size and/or use of iterative reconstruction technique. COMPARISON:  Prior CT of the abdomen and pelvis at [facility] on 03/17/2018 FINDINGS: Lower chest: No acute abnormality. Hepatobiliary: No focal liver abnormality is seen. No gallstones, gallbladder wall thickening, or biliary dilatation. Pancreas: Unremarkable. No pancreatic ductal dilatation or surrounding inflammatory changes. Spleen: Normal in size without focal abnormality. Adrenals/Urinary Tract: Severe left-sided hydronephrosis and hydroureter present. Multiple calculi are present in the distal left ureter with the largest at the ureterovesical junction measuring 5-6 mm. Multiple lower pole calculi in the left kidney with the largest measuring 7 mm. There are some small nonobstructing calculi in the lower pole of the right kidney. No bladder calculi visualized. Normal adrenal glands. Stomach/Bowel: Bowel shows no evidence of obstruction, ileus, inflammation or lesion. The appendix is not discretely visualized. No free intraperitoneal air. Vascular/Lymphatic: No significant vascular findings are present. No enlarged abdominal or pelvic lymph nodes. Reproductive: Prostate is unremarkable. Other: No abdominal wall hernia or abnormality. No abdominopelvic ascites. Musculoskeletal: No acute or significant osseous findings. IMPRESSION: 1. Severe left-sided hydronephrosis and hydroureter with multiple calculi in the distal left ureter with the largest at the ureterovesical junction measuring 5-6 mm. 2. Multiple lower pole calculi in the left kidney with the largest measuring 7 mm. 3. Small nonobstructing calculi in the lower pole of the right kidney. Electronically Signed   By: Irish Lack M.D.   On: 08/14/2023 10:25    Procedures Procedures    Medications Ordered in ED Medications  oxyCODONE (Oxy IR/ROXICODONE) immediate release tablet 5 mg (5 mg Oral  Given 08/14/23 1002)  ondansetron (ZOFRAN-ODT) disintegrating tablet 4 mg (4 mg Oral Given 08/14/23 1005)    ED Course/ Medical Decision Making/ A&P                                 Medical Decision Making Amount and/or Complexity of Data Reviewed Labs: ordered. Radiology: ordered.  Risk Prescription drug management.   Patient is here with left-sided flank pain and left lower abdominal pain reported dysuria or difficulty with urination.  Differential include UTI versus ureteral colic or stone versus other.  I personally reviewed and interpreted patient's labs and imaging, notable for blood in urine, no evidence of infection.  Creatinine within normal limits.  No leukocytosis.  CT imaging is notable for distal left ureteral stone with several small stones behind this, 5 to 6 mm at the largest measurement, and some residual hydronephrosis.  Oral pain and nausea medication given in the ED  Supplemental history provided by [facility]  I reviewed the patient's external records including his CT renal stone study performed most recently November 2023, approximately 1 year ago, which noted numerous bilateral nonobstructing renal stones as well as a large obstructing stone at the right UVJ with moderate upstream hydroureteronephrosis  *  Plan: Given that the patient is tolerating p.o. and his pain appears to be improved with medications, he can be managed as an outpatient with pain medicine, Flomax, Motrin and Zofran.  He may need to follow-up with urologist if he does not pass the stone spontaneously on its own within the next 2 weeks.  I do not see evidence at this time for emergency surgery.  He does not have evidence of urosepsis, persistent vomiting, or intractable pain.  He verbalized understanding and agreement with the plan.        Final Clinical Impression(s) / ED Diagnoses Final diagnoses:  Ureteral colic  Kidney stone    Rx / DC Orders ED Discharge Orders           Ordered    oxyCODONE (ROXICODONE) 5 MG immediate release tablet  Every 8 hours PRN        08/14/23 1103    ibuprofen (ADVIL) 600 MG tablet  Every 6 hours PRN        08/14/23 1103    tamsulosin (FLOMAX) 0.4 MG CAPS capsule  Daily PRN        08/14/23 1103    ondansetron (ZOFRAN) 4 MG tablet  Every 8 hours PRN        08/14/23 1103              Terald Sleeper, MD 08/14/23 1103

## 2023-08-14 NOTE — ED Notes (Signed)
Patient to CT.

## 2023-08-14 NOTE — ED Notes (Signed)
Patient discharged by this RN. Patient verbalizes understanding instructions with no additional questions for this RN. Patient ambulatory to lobby for family to pick patient up.
